# Patient Record
Sex: Female | Born: 1975
Health system: Southern US, Community
[De-identification: ages and names within clinical notes are randomized; demographics above are authoritative.]

## PROBLEM LIST (undated history)

## (undated) ENCOUNTER — Emergency Department (HOSPITAL_COMMUNITY): Discharge status: Absent without leave | Payer: No Typology Code available for payment source

## (undated) ENCOUNTER — Inpatient Hospital Stay (HOSPITAL_COMMUNITY): Payer: Self-pay

## (undated) DIAGNOSIS — F319 Bipolar disorder, unspecified: Secondary | ICD-10-CM

## (undated) DIAGNOSIS — Z8759 Personal history of other complications of pregnancy, childbirth and the puerperium: Secondary | ICD-10-CM

## (undated) DIAGNOSIS — I10 Essential (primary) hypertension: Secondary | ICD-10-CM

## (undated) HISTORY — PX: NO PAST SURGERIES: SHX2092

---

## 1998-02-27 ENCOUNTER — Ambulatory Visit (HOSPITAL_COMMUNITY): Admission: RE | Admit: 1998-02-27 | Discharge: 1998-02-27 | Payer: Self-pay | Admitting: Family Medicine

## 1998-02-27 ENCOUNTER — Encounter: Payer: Self-pay | Admitting: Family Medicine

## 1998-07-10 ENCOUNTER — Emergency Department (HOSPITAL_COMMUNITY): Admission: EM | Admit: 1998-07-10 | Discharge: 1998-07-10 | Payer: Self-pay | Admitting: Emergency Medicine

## 1998-09-09 ENCOUNTER — Emergency Department (HOSPITAL_COMMUNITY): Admission: EM | Admit: 1998-09-09 | Discharge: 1998-09-09 | Payer: Self-pay | Admitting: Emergency Medicine

## 1999-04-25 ENCOUNTER — Encounter: Payer: Self-pay | Admitting: Emergency Medicine

## 1999-04-25 ENCOUNTER — Emergency Department (HOSPITAL_COMMUNITY): Admission: EM | Admit: 1999-04-25 | Discharge: 1999-04-25 | Payer: Self-pay | Admitting: Emergency Medicine

## 2000-03-25 ENCOUNTER — Emergency Department (HOSPITAL_COMMUNITY): Admission: EM | Admit: 2000-03-25 | Discharge: 2000-03-25 | Payer: Self-pay | Admitting: Emergency Medicine

## 2001-02-04 ENCOUNTER — Other Ambulatory Visit: Admission: RE | Admit: 2001-02-04 | Discharge: 2001-02-04 | Payer: Self-pay | Admitting: *Deleted

## 2001-05-10 ENCOUNTER — Encounter (INDEPENDENT_AMBULATORY_CARE_PROVIDER_SITE_OTHER): Payer: Self-pay | Admitting: Specialist

## 2001-05-10 ENCOUNTER — Encounter: Payer: Self-pay | Admitting: *Deleted

## 2001-05-10 ENCOUNTER — Inpatient Hospital Stay (HOSPITAL_COMMUNITY): Admission: AD | Admit: 2001-05-10 | Discharge: 2001-05-13 | Payer: Self-pay | Admitting: *Deleted

## 2002-05-16 ENCOUNTER — Inpatient Hospital Stay (HOSPITAL_COMMUNITY): Admission: AD | Admit: 2002-05-16 | Discharge: 2002-05-16 | Payer: Self-pay | Admitting: Obstetrics and Gynecology

## 2002-05-18 ENCOUNTER — Inpatient Hospital Stay (HOSPITAL_COMMUNITY): Admission: AD | Admit: 2002-05-18 | Discharge: 2002-05-18 | Payer: Self-pay | Admitting: Obstetrics and Gynecology

## 2003-09-06 ENCOUNTER — Emergency Department (HOSPITAL_COMMUNITY): Admission: EM | Admit: 2003-09-06 | Discharge: 2003-09-06 | Payer: Self-pay | Admitting: Emergency Medicine

## 2003-10-06 ENCOUNTER — Emergency Department (HOSPITAL_COMMUNITY): Admission: EM | Admit: 2003-10-06 | Discharge: 2003-10-06 | Payer: Self-pay

## 2004-03-11 ENCOUNTER — Inpatient Hospital Stay (HOSPITAL_COMMUNITY): Admission: AD | Admit: 2004-03-11 | Discharge: 2004-03-11 | Payer: Self-pay | Admitting: *Deleted

## 2004-03-14 ENCOUNTER — Inpatient Hospital Stay (HOSPITAL_COMMUNITY): Admission: AD | Admit: 2004-03-14 | Discharge: 2004-03-14 | Payer: Self-pay | Admitting: Obstetrics & Gynecology

## 2004-03-16 ENCOUNTER — Inpatient Hospital Stay (HOSPITAL_COMMUNITY): Admission: AD | Admit: 2004-03-16 | Discharge: 2004-03-16 | Payer: Self-pay | Admitting: Obstetrics and Gynecology

## 2004-03-24 ENCOUNTER — Inpatient Hospital Stay (HOSPITAL_COMMUNITY): Admission: AD | Admit: 2004-03-24 | Discharge: 2004-03-24 | Payer: Self-pay | Admitting: Family Medicine

## 2004-03-27 ENCOUNTER — Inpatient Hospital Stay (HOSPITAL_COMMUNITY): Admission: AD | Admit: 2004-03-27 | Discharge: 2004-03-27 | Payer: Self-pay | Admitting: Obstetrics & Gynecology

## 2004-03-30 ENCOUNTER — Inpatient Hospital Stay (HOSPITAL_COMMUNITY): Admission: AD | Admit: 2004-03-30 | Discharge: 2004-03-30 | Payer: Self-pay | Admitting: *Deleted

## 2004-04-09 ENCOUNTER — Inpatient Hospital Stay (HOSPITAL_COMMUNITY): Admission: AD | Admit: 2004-04-09 | Discharge: 2004-04-09 | Payer: Self-pay | Admitting: Obstetrics and Gynecology

## 2004-09-17 ENCOUNTER — Inpatient Hospital Stay (HOSPITAL_COMMUNITY): Admission: AD | Admit: 2004-09-17 | Discharge: 2004-09-17 | Payer: Self-pay | Admitting: Family Medicine

## 2004-09-28 ENCOUNTER — Inpatient Hospital Stay (HOSPITAL_COMMUNITY): Admission: AD | Admit: 2004-09-28 | Discharge: 2004-09-28 | Payer: Self-pay | Admitting: *Deleted

## 2004-10-31 ENCOUNTER — Inpatient Hospital Stay (HOSPITAL_COMMUNITY): Admission: AD | Admit: 2004-10-31 | Discharge: 2004-10-31 | Payer: Self-pay | Admitting: Obstetrics & Gynecology

## 2005-04-07 ENCOUNTER — Inpatient Hospital Stay (HOSPITAL_COMMUNITY): Admission: RE | Admit: 2005-04-07 | Discharge: 2005-04-09 | Payer: Self-pay | Admitting: *Deleted

## 2005-04-07 ENCOUNTER — Ambulatory Visit: Payer: Self-pay | Admitting: *Deleted

## 2005-04-11 ENCOUNTER — Inpatient Hospital Stay (HOSPITAL_COMMUNITY): Admission: AD | Admit: 2005-04-11 | Discharge: 2005-04-11 | Payer: Self-pay | Admitting: Obstetrics

## 2005-04-15 ENCOUNTER — Ambulatory Visit (HOSPITAL_COMMUNITY): Admission: RE | Admit: 2005-04-15 | Discharge: 2005-04-15 | Payer: Self-pay | Admitting: Obstetrics & Gynecology

## 2005-04-15 ENCOUNTER — Ambulatory Visit: Payer: Self-pay | Admitting: Obstetrics & Gynecology

## 2005-04-17 ENCOUNTER — Ambulatory Visit: Payer: Self-pay | Admitting: *Deleted

## 2005-04-22 ENCOUNTER — Ambulatory Visit: Payer: Self-pay | Admitting: *Deleted

## 2005-04-24 ENCOUNTER — Ambulatory Visit: Payer: Self-pay | Admitting: *Deleted

## 2005-04-29 ENCOUNTER — Ambulatory Visit: Payer: Self-pay | Admitting: Obstetrics & Gynecology

## 2005-04-29 ENCOUNTER — Ambulatory Visit (HOSPITAL_COMMUNITY): Admission: RE | Admit: 2005-04-29 | Discharge: 2005-04-29 | Payer: Self-pay | Admitting: Obstetrics & Gynecology

## 2005-05-01 ENCOUNTER — Ambulatory Visit: Payer: Self-pay | Admitting: *Deleted

## 2005-05-01 ENCOUNTER — Inpatient Hospital Stay (HOSPITAL_COMMUNITY): Admission: AD | Admit: 2005-05-01 | Discharge: 2005-05-04 | Payer: Self-pay | Admitting: Family Medicine

## 2005-05-02 ENCOUNTER — Encounter (INDEPENDENT_AMBULATORY_CARE_PROVIDER_SITE_OTHER): Payer: Self-pay | Admitting: Specialist

## 2005-05-27 ENCOUNTER — Inpatient Hospital Stay (HOSPITAL_COMMUNITY): Admission: AD | Admit: 2005-05-27 | Discharge: 2005-05-27 | Payer: Self-pay | Admitting: Obstetrics and Gynecology

## 2005-06-08 ENCOUNTER — Emergency Department (HOSPITAL_COMMUNITY): Admission: EM | Admit: 2005-06-08 | Discharge: 2005-06-08 | Payer: Self-pay | Admitting: Emergency Medicine

## 2005-06-26 ENCOUNTER — Inpatient Hospital Stay (HOSPITAL_COMMUNITY): Admission: AD | Admit: 2005-06-26 | Discharge: 2005-06-26 | Payer: Self-pay | Admitting: Gynecology

## 2006-01-13 ENCOUNTER — Ambulatory Visit: Payer: Self-pay | Admitting: Family Medicine

## 2006-01-20 ENCOUNTER — Ambulatory Visit: Payer: Self-pay | Admitting: Family Medicine

## 2006-01-25 ENCOUNTER — Inpatient Hospital Stay (HOSPITAL_COMMUNITY): Admission: AD | Admit: 2006-01-25 | Discharge: 2006-01-25 | Payer: Self-pay | Admitting: Gynecology

## 2006-03-01 ENCOUNTER — Ambulatory Visit (HOSPITAL_COMMUNITY): Admission: RE | Admit: 2006-03-01 | Discharge: 2006-03-01 | Payer: Self-pay | Admitting: Gynecology

## 2006-03-03 ENCOUNTER — Ambulatory Visit: Payer: Self-pay | Admitting: Obstetrics & Gynecology

## 2006-07-02 IMAGING — US US OB TRANSVAGINAL
1 series · 14 of 28 positions shown · non-contrast
Comparison: none

CLINICAL DATA: 6 week 6 day gestational age by LMP.  Inappropriately rising beta HCG level currently at 673.
TRANSVAGINAL OBSTETRICAL ULTRASOUND:
Transvaginal sonography was performed, and comparison made to the prior study on 03/11/04.
Maximum double-layer endometrial thickness measures 8 mm.  There is no evidence of an intrauterine gestational sac or other endometrial fluid collections.  No uterine masses are seen. 
A simple cyst is seen in the right ovary measuring 2 x 2.1 x 2.5 cm, and a simple cyst is also seen in the left ovary measuring 3.6 x 2.4 x 2.8 cm.  These are both new since the previous study.  No ovarian or adnexal masses are seen and there is no evidence of free fluid.

[Series 1: us ob transvaginal · 0.15mm/px · 14 of 52 slices shown]
[im 2/52]
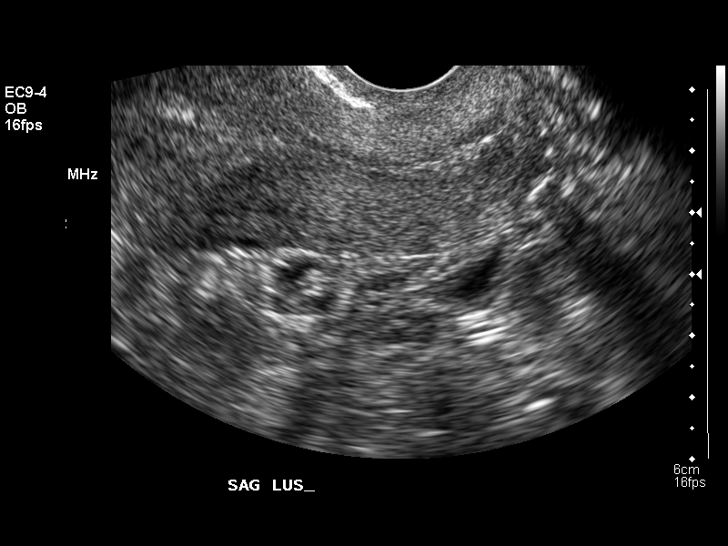
[im 6/52]
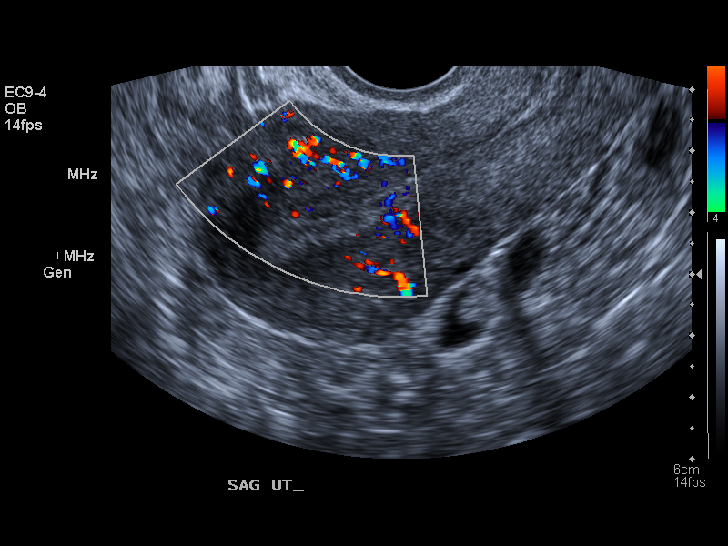
[im 10/52]
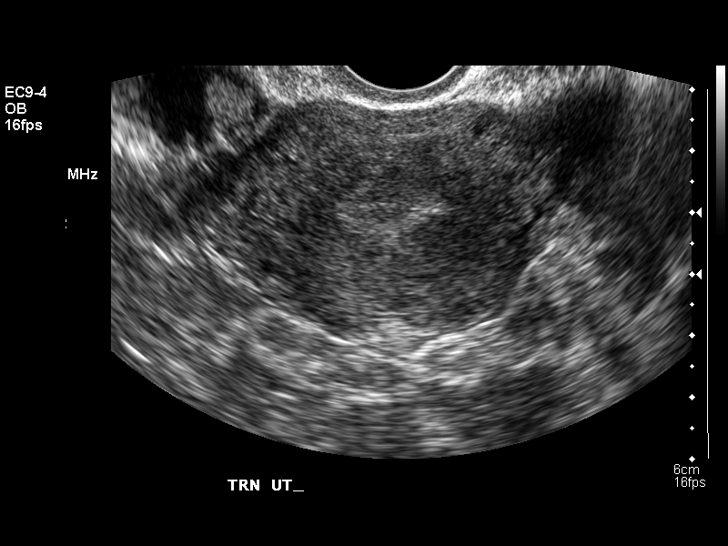
[im 14/52]
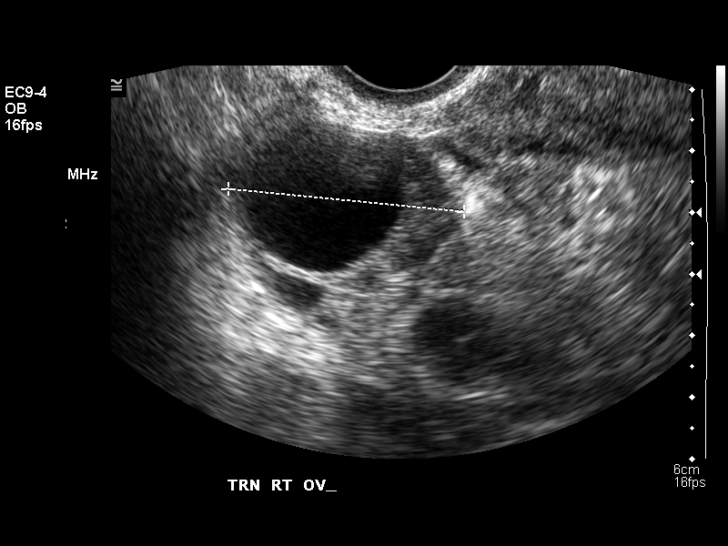
[im 18/52]
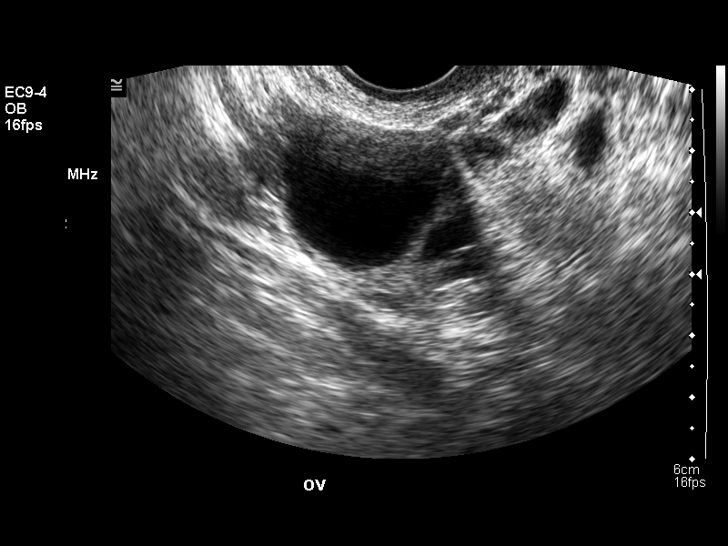
[im 21/52]
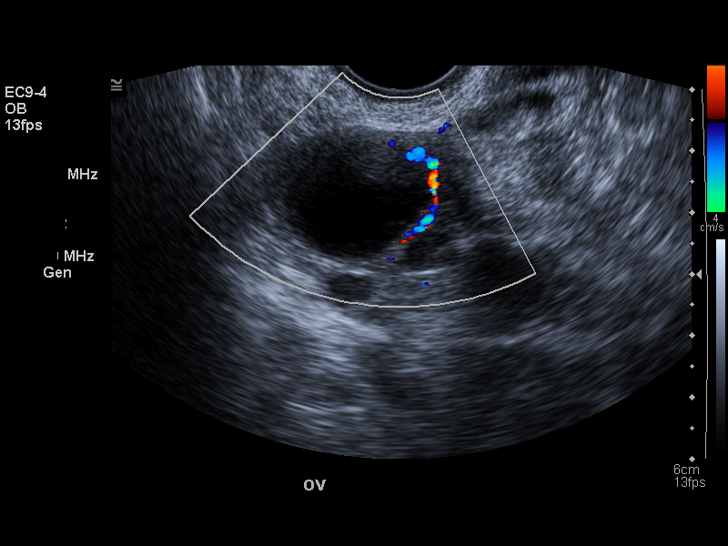
[im 25/52]
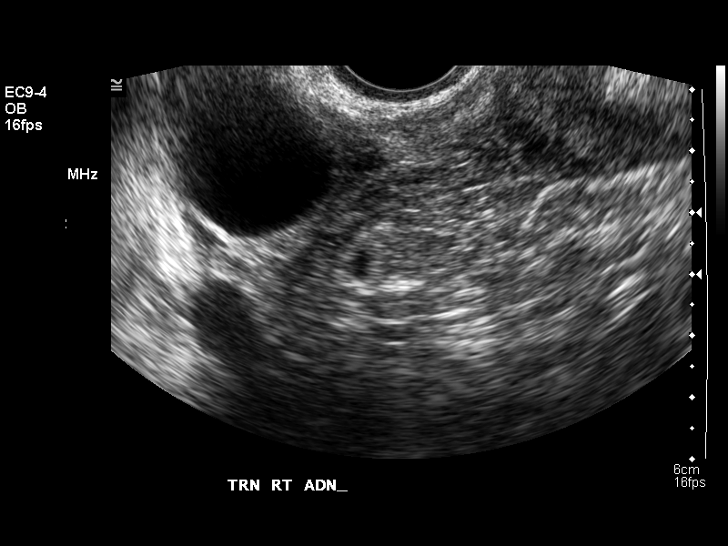
[im 29/52]
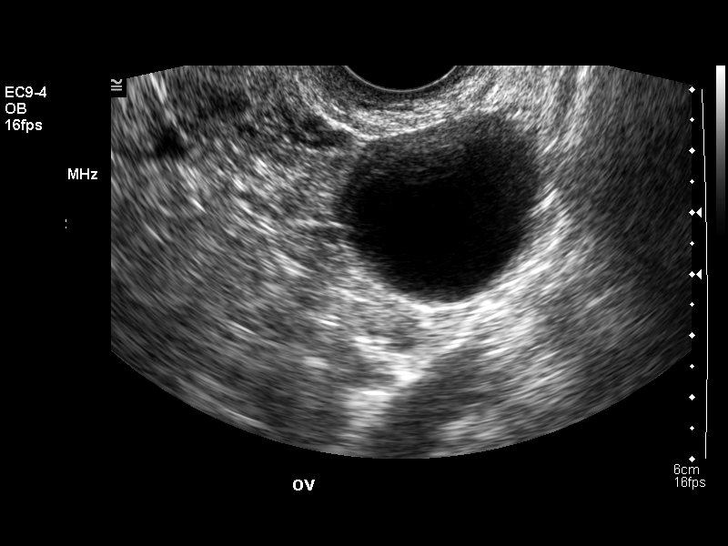
[im 33/52]
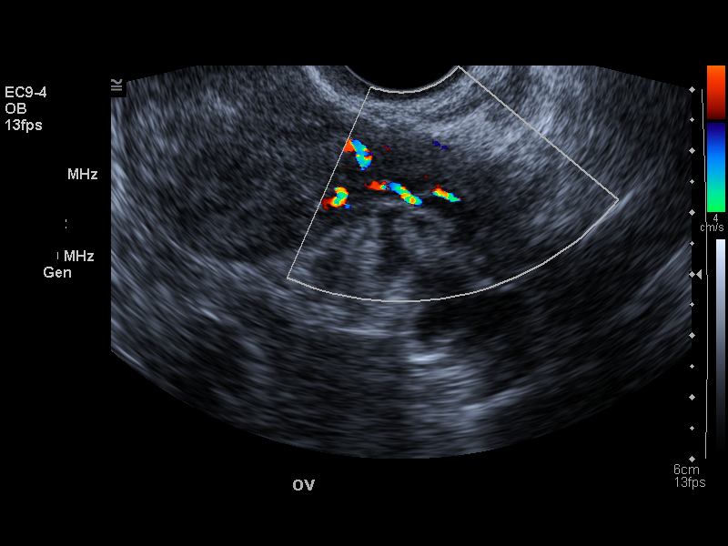
[im 36/52]
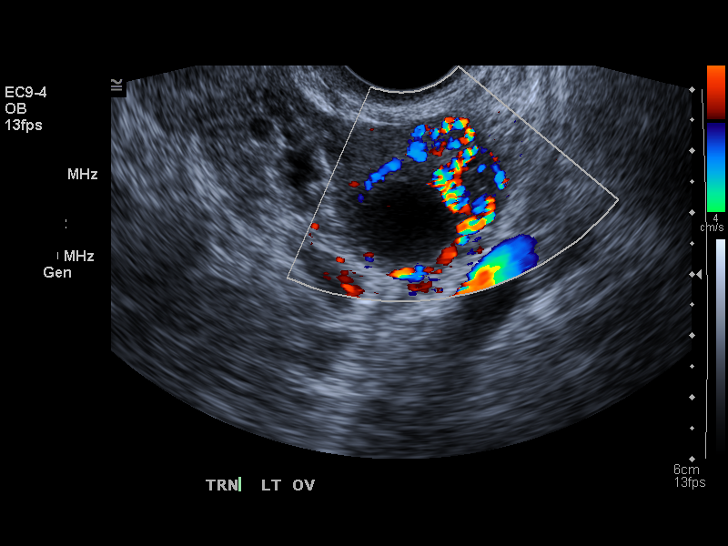
[im 40/52]
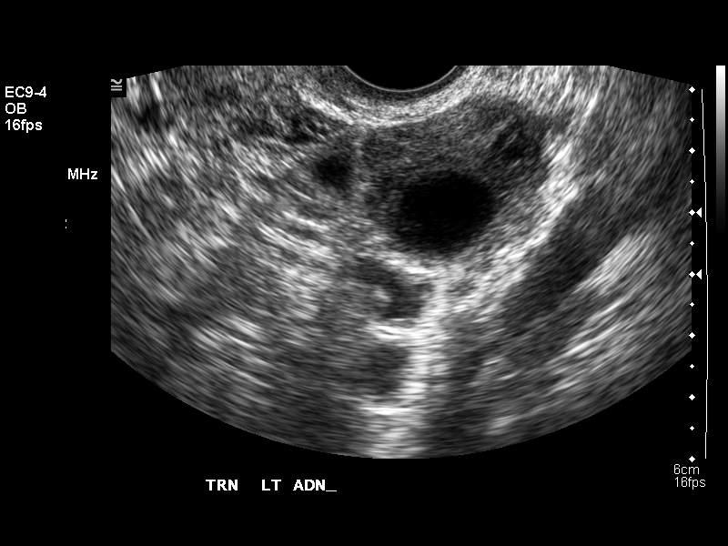
[im 44/52]
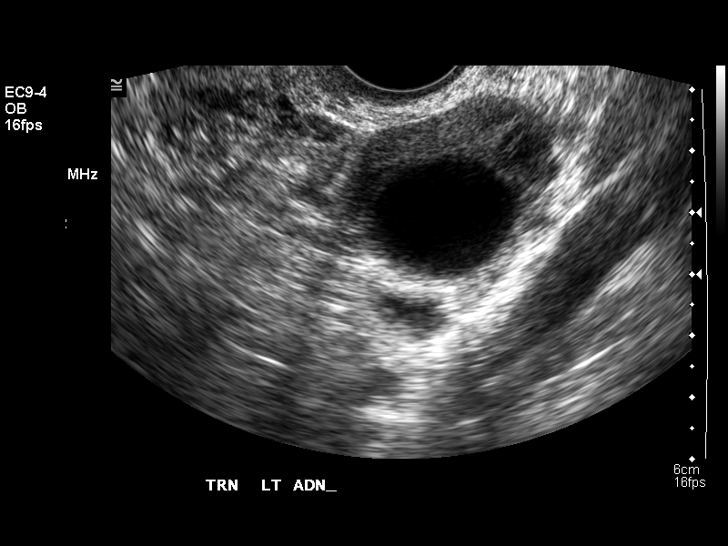
[im 48/52]
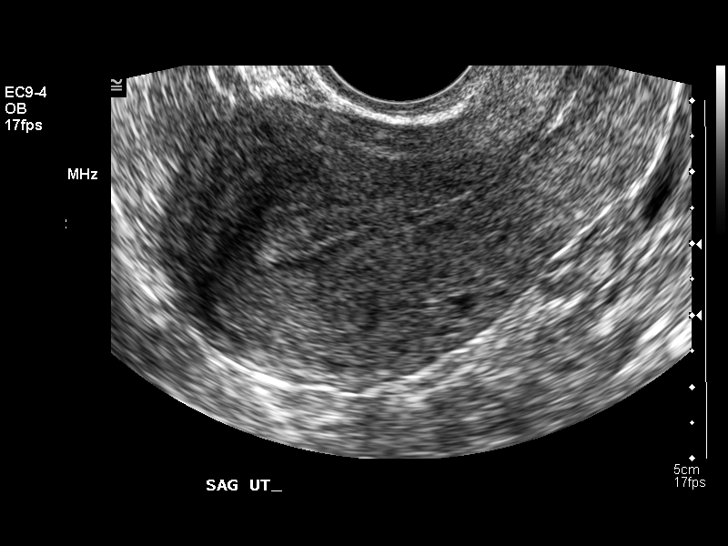
[im 52/52]
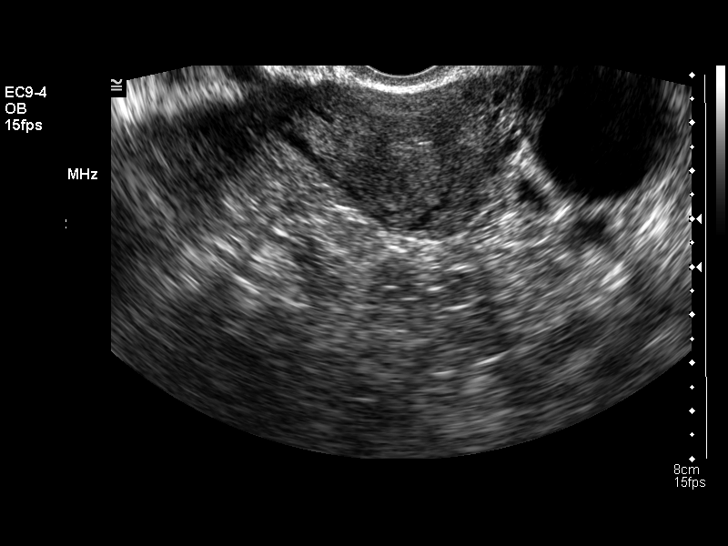

[14 of 28 positions shown; findings below may reference images not displayed]

IMPRESSION: 1.  No sonographic evidence of an intrauterine gestational sac or adnexal mass.  These findings are suspicious for an abnormal IUP or recent spontaneous abortion, although an ectopic pregnancy cannot definitely be excluded.  Continued follow-up of quantitative beta HCG levels is recommended.
2.  Simple bilateral ovarian cysts measuring 2.8 cm in the right ovary and 3.6 cm in the left ovary.  No evidence of free fluid.

## 2007-03-10 ENCOUNTER — Emergency Department (HOSPITAL_COMMUNITY): Admission: EM | Admit: 2007-03-10 | Discharge: 2007-03-10 | Payer: Self-pay | Admitting: Emergency Medicine

## 2007-03-21 ENCOUNTER — Ambulatory Visit: Payer: Self-pay | Admitting: Internal Medicine

## 2007-07-11 ENCOUNTER — Encounter: Payer: Self-pay | Admitting: Family Medicine

## 2007-07-11 ENCOUNTER — Ambulatory Visit: Payer: Self-pay | Admitting: Family Medicine

## 2007-07-11 LAB — CONVERTED CEMR LAB: GC Probe Amp, Genital: NEGATIVE

## 2007-07-16 IMAGING — US US FETAL BPP W/O NONSTRESS
1 series · 17 of 28 positions shown · non-contrast
Comparison: none

CLINICAL DATA: Size less than dates; decels; smoker.  Assigned gestational age is 35 weeks 1 day.

[Series 1: us ob comp +14 wk · 17 of 42 slices shown]
[im 1/42]
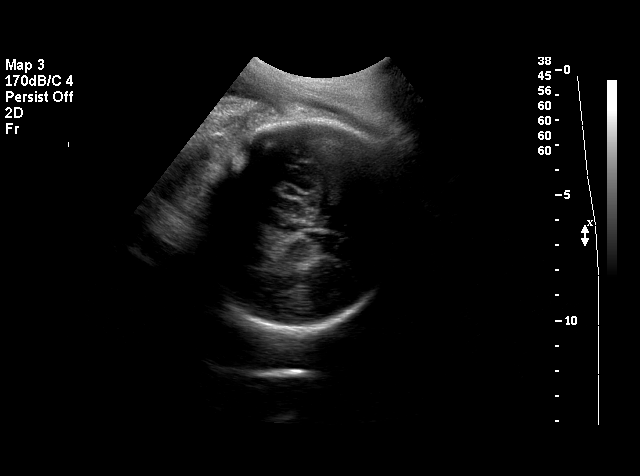
[im 4/42]
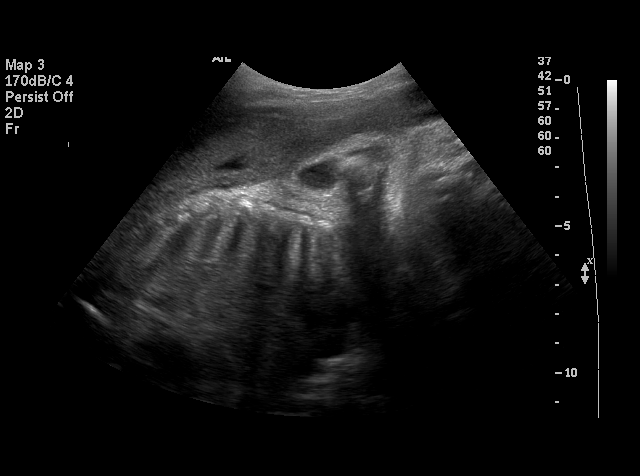
[im 7/42]
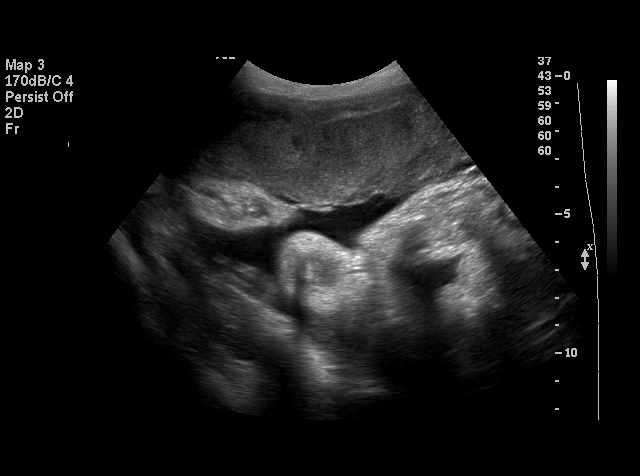
[im 8/42]
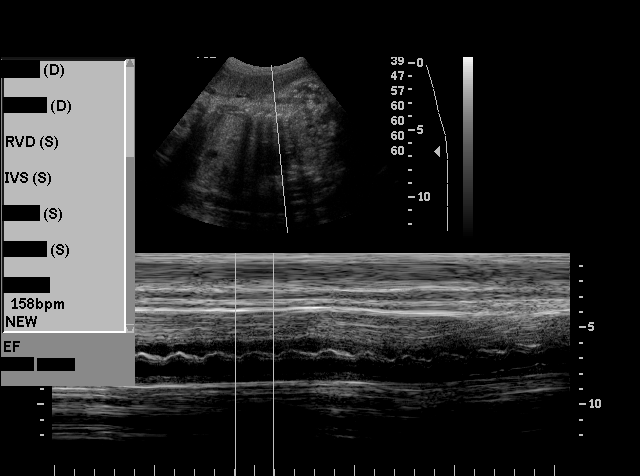
[im 11/42]
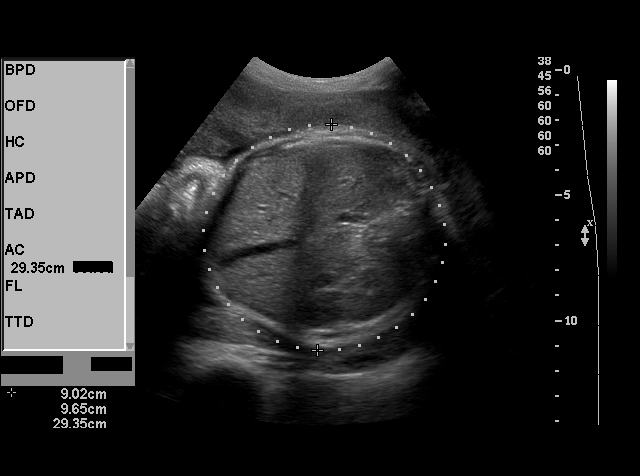
[im 14/42]
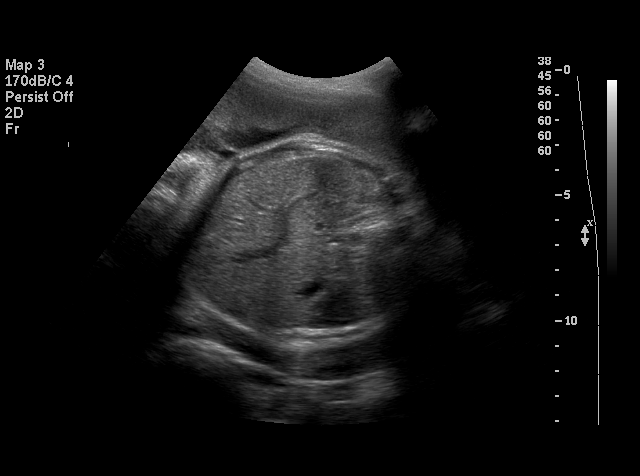
[im 16/42]
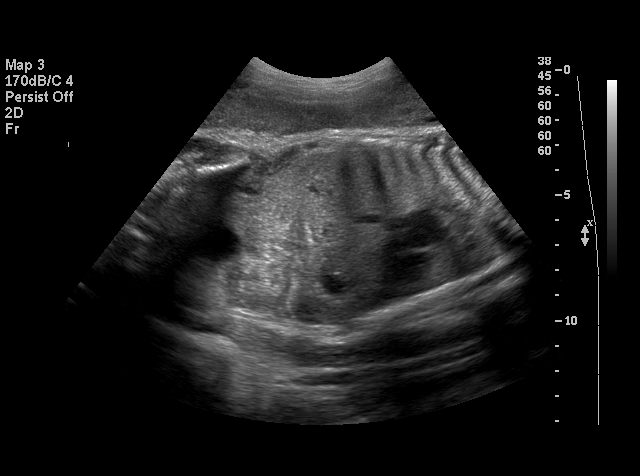
[im 19/42]
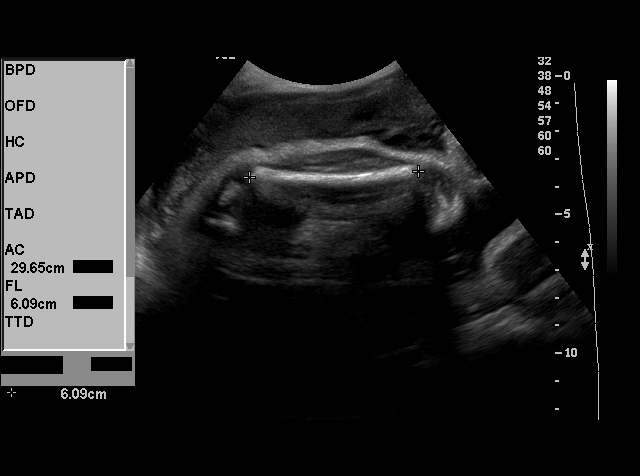
[im 22/42]
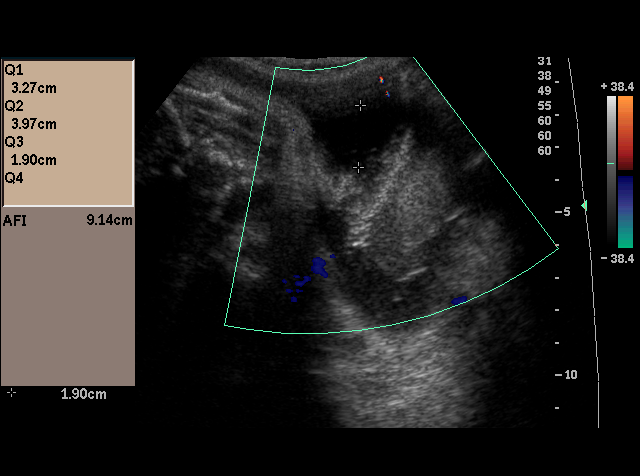
[im 23/42]
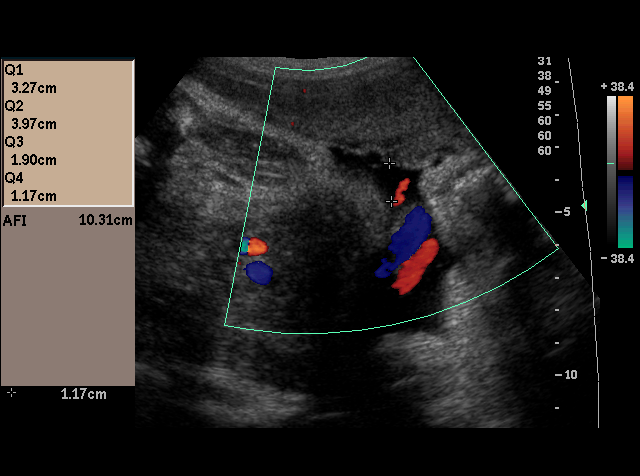
[im 26/42]
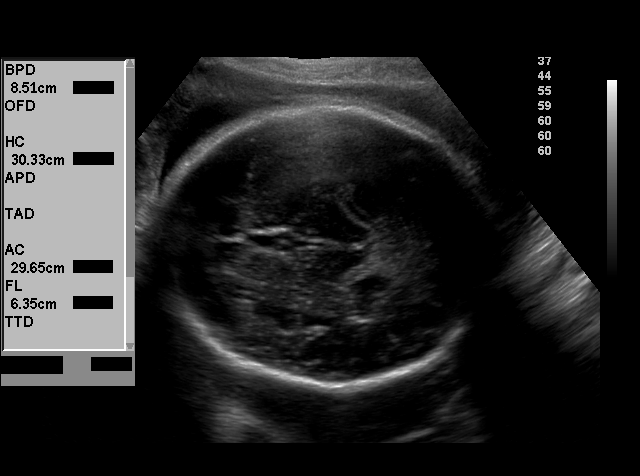
[im 28/42]
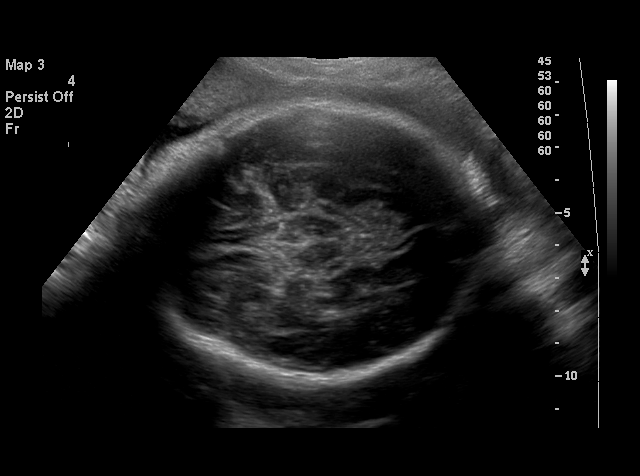
[im 31/42]
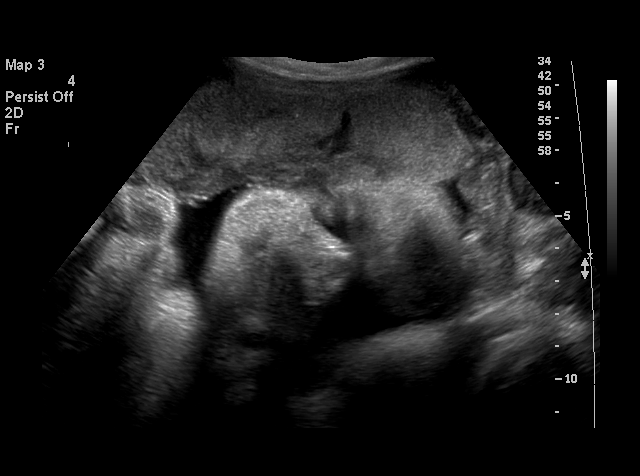
[im 34/42]
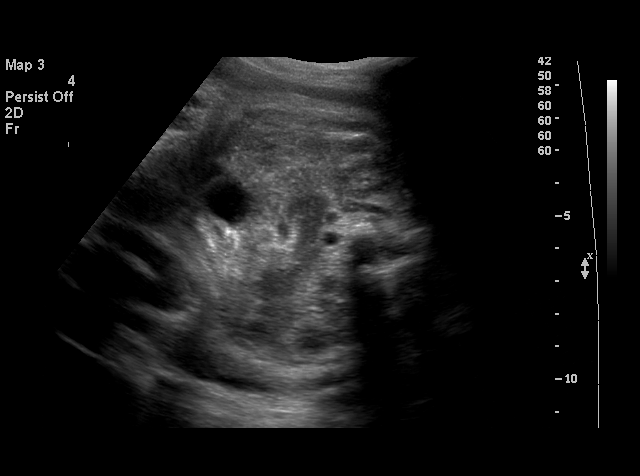
[im 35/42]
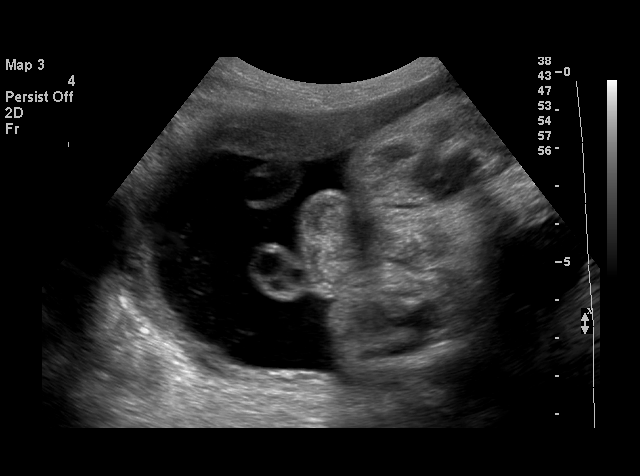
[im 38/42]
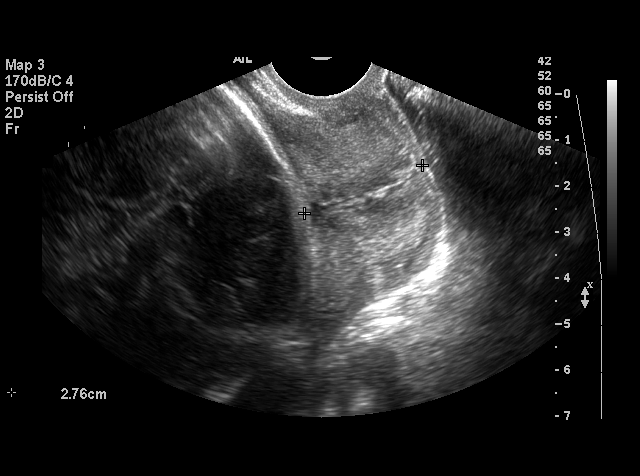
[im 42/42]
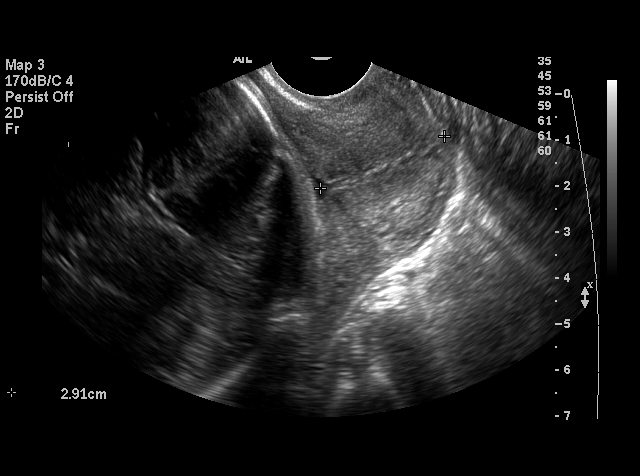

[17 of 28 positions shown; findings below may reference images not displayed]

OBSTETRICAL ULTRASOUND AND TRANSVAGINAL OB US:
 Number of Fetuses:  1
 Heart Rate:  158
 Movement:  Yes
 Breathing:  Yes  
 Presentation:  Cephalic
 Placental Location:  Anterior
 Grade:  II
 Previa:  No
 Amniotic Fluid (Subjective):  Normal
 Amniotic Fluid (Objective):   10.3 cm AFI (5th -95th%ile = 7.9 ? 24.9 cm for 35 wks)

 FETAL BIOMETRY
 BPD:   8.4 cm  34 w 0 d
 HC:   30.6 cm  34 w 1 d
 AC:   29.6 cm   33 w 5 d
 FL:    6.2 cm  32 w 3 d

 MEAN GA:  33 w 4 d  US EDC:  05/22/05
 Assigned GA:  35 w 1 d  Assigned EDC:  05/11/05    
 EFW:  6936 g (H) 25th ? 50th%ile (2128 ? 4797 g) For 35 wks

 FETAL ANATOMY
 Lateral Ventricles:    Visualized 
 Thalami/CSP:      Visualized 
 Posterior Fossa:  Not visualized 
 Nuchal Region:    N/A
 Spine:      Not visualized 
 4 Chamber Heart on Left:      Visualized 
 Stomach on Left:      Visualized 
 3 Vessel Cord:    Visualized 
 Cord Insertion site:    Not visualized 
 Kidneys:  Visualized 
 Bladder:  Visualized 
 Extremities:      Not visualized 

 ADDITIONAL ANATOMY VISUALIZED:  Upper lip, diaphragm, and male genitalia.

 Evaluation limited by:  Advanced gestational age.

 MATERNAL UTERINE AND ADNEXAL FINDINGS
 Cervix: 2.9 cm Transvaginally

 BIOPHYSICAL PROFILE

 Movement:  2    Time:  30 minutes
 Breathing:  2
 Tone:  2
 Amniotic Fluid:  2

 Total Score:  8
IMPRESSION: 1.  There is a single living intrauterine gestation in cephalic presentation.  The mean gestational age by today's ultrasound is 33 weeks 4 days which is 1 ? weeks younger than estimated by LMP; however, the fetal indices remain concordant.  The estimated fetal weight is at the 25th ? 50th percentile for a 35 week gestation.  
 2.  The biophysical score is [DATE] over a 30 minute period.  
 3.  The cervix is slightly shortened measuring 2.9 cm, transvaginally.

## 2007-08-03 ENCOUNTER — Ambulatory Visit (HOSPITAL_COMMUNITY): Admission: RE | Admit: 2007-08-03 | Discharge: 2007-08-03 | Payer: Medicaid Other | Admitting: Family Medicine

## 2007-09-02 ENCOUNTER — Other Ambulatory Visit: Admission: RE | Admit: 2007-09-02 | Discharge: 2007-09-02 | Payer: Self-pay | Admitting: Obstetrics and Gynecology

## 2007-09-02 ENCOUNTER — Ambulatory Visit: Payer: Self-pay | Admitting: Gynecology

## 2007-09-21 ENCOUNTER — Ambulatory Visit: Payer: Self-pay | Admitting: Obstetrics and Gynecology

## 2008-01-09 ENCOUNTER — Ambulatory Visit: Payer: Self-pay | Admitting: Family Medicine

## 2008-01-25 ENCOUNTER — Encounter: Payer: Self-pay | Admitting: Family Medicine

## 2008-01-25 ENCOUNTER — Ambulatory Visit: Payer: Self-pay | Admitting: Internal Medicine

## 2008-01-25 LAB — CONVERTED CEMR LAB
Alkaline Phosphatase: 100 units/L (ref 39–117)
Basophils Absolute: 0 10*3/uL (ref 0.0–0.1)
CO2: 24 meq/L (ref 19–32)
Cholesterol: 226 mg/dL — ABNORMAL HIGH (ref 0–200)
Creatinine, Ser: 0.85 mg/dL (ref 0.40–1.20)
Eosinophils Absolute: 0 10*3/uL (ref 0.0–0.7)
Eosinophils Relative: 1 % (ref 0–5)
Glucose, Bld: 113 mg/dL — ABNORMAL HIGH (ref 70–99)
HCT: 42.2 % (ref 36.0–46.0)
Hemoglobin: 13.8 g/dL (ref 12.0–15.0)
Lymphocytes Relative: 34 % (ref 12–46)
Lymphs Abs: 1.9 10*3/uL (ref 0.7–4.0)
MCV: 91.7 fL (ref 78.0–100.0)
Monocytes Absolute: 0.3 10*3/uL (ref 0.1–1.0)
RDW: 13.1 % (ref 11.5–15.5)
Total Bilirubin: 0.4 mg/dL (ref 0.3–1.2)
Total CHOL/HDL Ratio: 3.6
Triglycerides: 152 mg/dL — ABNORMAL HIGH (ref <150)
VLDL: 30 mg/dL (ref 0–40)

## 2008-08-22 ENCOUNTER — Ambulatory Visit (HOSPITAL_COMMUNITY): Admission: RE | Admit: 2008-08-22 | Discharge: 2008-08-22 | Payer: Self-pay | Admitting: Family Medicine

## 2008-08-22 ENCOUNTER — Encounter (INDEPENDENT_AMBULATORY_CARE_PROVIDER_SITE_OTHER): Payer: Self-pay | Admitting: Internal Medicine

## 2008-08-22 ENCOUNTER — Ambulatory Visit: Payer: Self-pay | Admitting: Family Medicine

## 2008-08-22 LAB — CONVERTED CEMR LAB
AST: 27 units/L (ref 0–37)
Alkaline Phosphatase: 108 units/L (ref 39–117)
BUN: 10 mg/dL (ref 6–23)
Basophils Absolute: 0 10*3/uL (ref 0.0–0.1)
Basophils Relative: 0 % (ref 0–1)
Calcium: 9.7 mg/dL (ref 8.4–10.5)
Chloride: 102 meq/L (ref 96–112)
Creatinine, Ser: 0.92 mg/dL (ref 0.40–1.20)
Eosinophils Absolute: 0 10*3/uL (ref 0.0–0.7)
Eosinophils Relative: 1 % (ref 0–5)
HCT: 39.2 % (ref 36.0–46.0)
MCHC: 33.2 g/dL (ref 30.0–36.0)
MCV: 91.4 fL (ref 78.0–100.0)
Platelets: 384 10*3/uL (ref 150–400)
RDW: 14 % (ref 11.5–15.5)
Total Bilirubin: 0.3 mg/dL (ref 0.3–1.2)

## 2008-12-03 ENCOUNTER — Emergency Department (HOSPITAL_COMMUNITY): Admission: EM | Admit: 2008-12-03 | Discharge: 2008-12-04 | Payer: Self-pay | Admitting: Emergency Medicine

## 2009-02-07 ENCOUNTER — Emergency Department (HOSPITAL_COMMUNITY): Admission: EM | Admit: 2009-02-07 | Discharge: 2009-02-07 | Payer: Self-pay | Admitting: Emergency Medicine

## 2009-05-14 ENCOUNTER — Emergency Department (HOSPITAL_COMMUNITY): Admission: EM | Admit: 2009-05-14 | Discharge: 2009-05-14 | Payer: Self-pay | Admitting: Emergency Medicine

## 2009-12-16 ENCOUNTER — Emergency Department (HOSPITAL_COMMUNITY): Admission: EM | Admit: 2009-12-16 | Discharge: 2009-12-16 | Payer: Self-pay | Admitting: Emergency Medicine

## 2010-04-20 ENCOUNTER — Encounter: Payer: Self-pay | Admitting: *Deleted

## 2010-05-20 ENCOUNTER — Emergency Department (HOSPITAL_BASED_OUTPATIENT_CLINIC_OR_DEPARTMENT_OTHER)
Admission: EM | Admit: 2010-05-20 | Discharge: 2010-05-20 | Disposition: A | Discharge status: Home / Self Care | Payer: Self-pay | Attending: Emergency Medicine | Admitting: Emergency Medicine

## 2010-05-20 DIAGNOSIS — K219 Gastro-esophageal reflux disease without esophagitis: Secondary | ICD-10-CM | POA: Insufficient documentation

## 2010-05-20 DIAGNOSIS — I10 Essential (primary) hypertension: Secondary | ICD-10-CM | POA: Insufficient documentation

## 2010-05-20 DIAGNOSIS — F319 Bipolar disorder, unspecified: Secondary | ICD-10-CM | POA: Insufficient documentation

## 2010-05-20 LAB — DIFFERENTIAL
Basophils Absolute: 0 10*3/uL (ref 0.0–0.1)
Lymphocytes Relative: 40 % (ref 12–46)
Monocytes Absolute: 0.7 10*3/uL (ref 0.1–1.0)
Neutro Abs: 3.3 10*3/uL (ref 1.7–7.7)

## 2010-05-20 LAB — POCT CARDIAC MARKERS
CKMB, poc: <1 ng/mL — ABNORMAL LOW (ref 1.0–8.0)
Troponin i, poc: <0.05 ng/mL (ref 0.00–0.09)

## 2010-05-20 LAB — CBC
Hemoglobin: 11.2 g/dL — ABNORMAL LOW (ref 12.0–15.0)
MCH: 25.9 pg — ABNORMAL LOW (ref 26.0–34.0)
Platelets: 317 10*3/uL (ref 150–400)
RBC: 4.33 MIL/uL (ref 3.87–5.11)
WBC: 6.8 10*3/uL (ref 4.0–10.5)

## 2010-05-20 LAB — URINALYSIS, ROUTINE W REFLEX MICROSCOPIC
Bilirubin Urine: NEGATIVE
Nitrite: NEGATIVE
Specific Gravity, Urine: 1.011 (ref 1.005–1.030)
pH: 6.5 (ref 5.0–8.0)

## 2010-05-20 LAB — BASIC METABOLIC PANEL
BUN: 9 mg/dL (ref 6–23)
Calcium: 9.1 mg/dL (ref 8.4–10.5)
Creatinine, Ser: 0.9 mg/dL (ref 0.4–1.2)
GFR calc Af Amer: >60 mL/min (ref >60)
GFR calc non Af Amer: >60 mL/min (ref >60)

## 2010-06-12 LAB — DIFFERENTIAL
Basophils Absolute: 0 10*3/uL (ref 0.0–0.1)
Eosinophils Relative: 0 % (ref 0–5)
Lymphocytes Relative: 11 % — ABNORMAL LOW (ref 12–46)
Lymphs Abs: 1.2 10*3/uL (ref 0.7–4.0)
Neutro Abs: 9.1 10*3/uL — ABNORMAL HIGH (ref 1.7–7.7)
Neutrophils Relative %: 79 % — ABNORMAL HIGH (ref 43–77)

## 2010-06-12 LAB — POCT I-STAT, CHEM 8
Creatinine, Ser: 0.9 mg/dL (ref 0.4–1.2)
HCT: 38 % (ref 36.0–46.0)
Hemoglobin: 12.9 g/dL (ref 12.0–15.0)
Sodium: 137 mEq/L (ref 135–145)
TCO2: 23 mmol/L (ref 0–100)

## 2010-06-12 LAB — CBC
Platelets: 285 10*3/uL (ref 150–400)
RBC: 4.61 MIL/uL (ref 3.87–5.11)
RDW: 13.9 % (ref 11.5–15.5)
WBC: 11.5 10*3/uL — ABNORMAL HIGH (ref 4.0–10.5)

## 2010-06-12 LAB — PROTIME-INR
INR: 0.93 (ref 0.00–1.49)
Prothrombin Time: 12.7 seconds (ref 11.6–15.2)

## 2010-07-02 LAB — POCT I-STAT, CHEM 8
Creatinine, Ser: 0.9 mg/dL (ref 0.4–1.2)
Glucose, Bld: 93 mg/dL (ref 70–99)
Hemoglobin: 17 g/dL — ABNORMAL HIGH (ref 12.0–15.0)
Sodium: 135 mEq/L (ref 135–145)
TCO2: 26 mmol/L (ref 0–100)

## 2010-07-02 LAB — BASIC METABOLIC PANEL
CO2: 26 mEq/L (ref 19–32)
Calcium: 9.8 mg/dL (ref 8.4–10.5)
Creatinine, Ser: 0.9 mg/dL (ref 0.4–1.2)
GFR calc Af Amer: >60 mL/min (ref >60)
GFR calc non Af Amer: >60 mL/min (ref >60)
Glucose, Bld: 97 mg/dL (ref 70–99)

## 2010-07-04 LAB — COMPREHENSIVE METABOLIC PANEL
ALT: 13 U/L (ref 0–35)
AST: 15 U/L (ref 0–37)
CO2: 25 mEq/L (ref 19–32)
Calcium: 8.3 mg/dL — ABNORMAL LOW (ref 8.4–10.5)
Chloride: 108 mEq/L (ref 96–112)
GFR calc Af Amer: >60 mL/min (ref >60)
GFR calc non Af Amer: 53 mL/min — ABNORMAL LOW (ref >60)
Sodium: 136 mEq/L (ref 135–145)

## 2010-07-04 LAB — URINALYSIS, ROUTINE W REFLEX MICROSCOPIC
Glucose, UA: NEGATIVE mg/dL
pH: 5.5 (ref 5.0–8.0)

## 2010-07-04 LAB — URINE CULTURE: Colony Count: >=100000

## 2010-07-04 LAB — TROPONIN I: Troponin I: 0.02 ng/mL (ref 0.00–0.06)

## 2010-07-04 LAB — DIFFERENTIAL
Basophils Absolute: 0 10*3/uL (ref 0.0–0.1)
Basophils Relative: 0 % (ref 0–1)
Lymphocytes Relative: 38 % (ref 12–46)
Neutro Abs: 3.6 10*3/uL (ref 1.7–7.7)

## 2010-07-04 LAB — CBC
MCHC: 33.3 g/dL (ref 30.0–36.0)
RBC: 3.85 MIL/uL — ABNORMAL LOW (ref 3.87–5.11)
WBC: 6.7 10*3/uL (ref 4.0–10.5)

## 2010-07-04 LAB — RAPID URINE DRUG SCREEN, HOSP PERFORMED: Tetrahydrocannabinol: NOT DETECTED

## 2010-07-04 LAB — CK TOTAL AND CKMB (NOT AT ARMC): CK, MB: 0.9 ng/mL (ref 0.3–4.0)

## 2010-07-04 LAB — URINE MICROSCOPIC-ADD ON

## 2010-07-04 LAB — ACETAMINOPHEN LEVEL: Acetaminophen (Tylenol), Serum: <10 ug/mL — ABNORMAL LOW (ref 10–30)

## 2010-08-15 NOTE — Group Therapy Note (Signed)
NAME:  [name], [name] NO.:  [id number]   MEDICAL RECORD NO.:  [id number]          PATIENT TYPE:  WOC   LOCATION:  [facility]                   FACILITY:  [facility]   PHYSICIAN:  Dorthula Perfect, MD     DATE OF BIRTH:  July 09, 1975   DATE OF SERVICE:                                  CLINIC NOTE   HISTORY OF PRESENT ILLNESS:  A 35 year old white female, gravida 2, para  2, presents today having been seen in the MAU with an ovarian cyst.  She  delivered back in February 2007.  She received Depo-Provera at one time.  During the last spring, early summer, she began cycling on a normal  basis, but then started having continuous vaginal bleeding.  For that,  she was seen in the MAU at the end of October, and an ultrasound at that  time revealed a complex ovarian cyst.  The left ovary measured  4.2x1.8x2.7 cm and the cyst within it measured 2.1x1.0x1.6.  Right ovary  was normal.  Follow up ultrasound on December 3 revealed both ovaries to  be completely normal.  She had a normal period last week.   PHYSICAL EXAMINATION:  ABDOMEN:  Soft and nontender.  PELVIC:  External genitalia/BUS glands are normal.  Vaginal vault is  epithelialized as was the cervix.  Uterus is in midline and is normal  size and shape.  Both ovaries are normal.   IMPRESSION:  1. Left ovarian cyst, resolved.  2. Contraception, oral.   DISPOSITION:  The patient is desirous of being on birth control pills.  She is given a prescription for Yaz with refills for a year.  In  addition, she is given three samples boxes.  She is instructed to start  the first pill pack on the first Sunday after she starts her next  menstrual period.  Her next Pap smear is due in March, having had a  normal one a year ago.           ______________________________  Dorthula Perfect, MD     ER/MEDQ  D:  03/03/2006  T:  03/04/2006  Job:  [id number]
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
## 2010-08-15 NOTE — Discharge Summary (Signed)
[name], [name]                ACCOUNT NO.:  [id number]   MEDICAL RECORD NO.:  [id number]          PATIENT TYPE:  INP   LOCATION:  9107                          FACILITY:  [facility]   PHYSICIAN:  Conni Elliot, M.D.DATE OF BIRTH:  Apr 17, 1975   DATE OF ADMISSION:  04/07/2005  DATE OF DISCHARGE:  04/09/2005                                 DISCHARGE SUMMARY   DISCHARGE DIAGNOSES:  1.  Pregnancy at 35 and 5/7.  2.  ? Intrahepatic cholestasis pregnancy.  3.  Smoking.  4.  History of marijuana use.  5.  Gastroesophageal reflux disease.  6.  Candida vaginitis.   MEDICATIONS:  1.  Ursodiol 300 mg twice daily.  2.  Zantac 75 twice daily.  3.  Nicotine patch 21 for 24 hours daily.  4.  Tylenol 650 mg every six hours as needed.  5.  Atarax 50 mg every six hours as needed.   PROCEDURES:  None.   HOSPITAL COURSE:  The patient is a 35 year old G6, P1-0-4-1 with a history  of ectopic and three spontaneous abortions that presented for her first  clinic office visit on April 07, 2005 with elevated blood pressures  140s/100s, and elevated LFTs with AST and ALT of 59 and 98.  The patient has  moved around and had several different sources for her prenatal care.  She  also had a main complaint of itching.  Her bilirubin, uric acid, and LDH  were all normal.  At admit, her creatinine was slightly elevated at 0.7.  Urine protein and then subsequently 24-hour protein levels were essentially  zero.  Creatinine clearance was 105 and her creatinine level returned to  normal.  At admission, she also had some evidence of mild hemoconcentration  with hemoglobin of 14.2.  This also resolved during her hospital stay.  Acute hepatitis panel was negative.  Her AST and ALT improved when repeated  on April 08, 2005.  Her strep was reassuring throughout her stay.  Cervix  check showed a 1 cm thick and -2.  She had no consistent contractions noted.  We ordered bile salts to confirm diagnosis of ICP, and  those are pending at  the time of discharge.  Urine drug screen was negative.   LABORATORY:  AST and ALT were 35 and 78 at discharge.  Hemoglobin was 12.9  down from 14.3.  Uric acid was 4 at discharge.  LDH was 114 at discharge.  Total protein over 24 hours was less than 6.  Creatinine clearance was 105  with a creatinine level of 0.6 at discharge.  Acute hepatitis panel was  negative.  Urine drug screen was negative.  Bile acid salts were pending at  discharge.   FOLLOW UP:  The patient will follow-up at the [facility] at  9:00 o'clock on February 13, 2006.  She will also follow-up at the MAU on  February 09, 2006 for labs including PIH panel, and non-stress test.  She  was discharged home with typical antenatal preterm labor precautions and  instructions.      Angeline Slim, M.D.  ______________________________  Conni Elliot, M.D.    AL/MEDQ  D:  04/09/2005  T:  04/09/2005  Job:  [id number]

## 2010-12-25 LAB — POCT PREGNANCY, URINE
Operator id: 297281
Preg Test, Ur: NEGATIVE

## 2011-05-17 ENCOUNTER — Encounter (HOSPITAL_COMMUNITY): Payer: Self-pay

## 2011-05-17 ENCOUNTER — Emergency Department (HOSPITAL_COMMUNITY)
Admission: EM | Admit: 2011-05-17 | Discharge: 2011-05-17 | Disposition: A | Discharge status: Home / Self Care | Payer: Self-pay | Attending: Emergency Medicine | Admitting: Emergency Medicine

## 2011-05-17 DIAGNOSIS — K047 Periapical abscess without sinus: Secondary | ICD-10-CM | POA: Insufficient documentation

## 2011-05-17 DIAGNOSIS — K029 Dental caries, unspecified: Secondary | ICD-10-CM | POA: Insufficient documentation

## 2011-05-17 DIAGNOSIS — R112 Nausea with vomiting, unspecified: Secondary | ICD-10-CM | POA: Insufficient documentation

## 2011-05-17 DIAGNOSIS — K089 Disorder of teeth and supporting structures, unspecified: Secondary | ICD-10-CM | POA: Insufficient documentation

## 2011-05-17 DIAGNOSIS — I10 Essential (primary) hypertension: Secondary | ICD-10-CM | POA: Insufficient documentation

## 2011-05-17 DIAGNOSIS — Z79899 Other long term (current) drug therapy: Secondary | ICD-10-CM | POA: Insufficient documentation

## 2011-05-17 DIAGNOSIS — K1370 Unspecified lesions of oral mucosa: Secondary | ICD-10-CM | POA: Insufficient documentation

## 2011-05-17 HISTORY — DX: Essential (primary) hypertension: I10

## 2011-05-17 MED ORDER — CLINDAMYCIN HCL 150 MG PO CAPS: 300.0000 mg | ORAL_CAPSULE | Freq: Three times a day (TID) | ORAL | Status: AC

## 2011-05-17 MED ORDER — OXYCODONE-ACETAMINOPHEN 5-325 MG PO TABS
1.0000 | ORAL_TABLET | Freq: Once | ORAL | Status: AC
  Administered 2011-05-17: 1 via ORAL
  Filled 2011-05-17: qty 1

## 2011-05-17 MED ORDER — HYDROCODONE-ACETAMINOPHEN 5-500 MG PO TABS: 1.0000 | ORAL_TABLET | Freq: Four times a day (QID) | ORAL | Status: AC | PRN

## 2011-05-17 MED ORDER — IBUPROFEN 600 MG PO TABS: 600.0000 mg | ORAL_TABLET | Freq: Four times a day (QID) | ORAL | Status: AC | PRN

## 2011-05-17 NOTE — ED Provider Notes (Signed)
History     CSN: [id number]  Arrival date & time 05/17/11  1323   First MD Initiated Contact with Patient 05/17/11 1338      Chief Complaint  Patient presents with  . Dental Pain    (Consider location/radiation/quality/duration/timing/severity/associated sxs/prior treatment) Patient is a 36 y.o. female presenting with tooth pain. The history is provided by the patient.  Dental PainThe primary symptoms include mouth pain. Primary symptoms do not include fever. The symptoms began less than 1 hour ago.  Pt states she has had problems with right lower molar tooth for last 8 months. States that yesterday pain worsened. Has swelling around that tooth, pain not relieved by ibuprofen. No fever, no facial swelling. Admits to nausea and vomiting today. No recent dental injury.  Past Medical History  Diagnosis Date  . Hypertension     History reviewed. No pertinent past surgical history.  No family history on file.  History  Substance Use Topics  . Smoking status: Former Games developer  . Smokeless tobacco: Not on file  . Alcohol Use: No    OB History    Grav Para Term Preterm Abortions TAB SAB Ect Mult Living                  Review of Systems  Constitutional: Negative for fever and chills.  HENT: Positive for dental problem. Negative for neck pain.   Eyes: Negative.   Respiratory: Negative.   Cardiovascular: Negative.   Gastrointestinal: Negative.   Genitourinary: Negative.   Musculoskeletal: Negative.   Skin: Negative.   Neurological: Negative.   Psychiatric/Behavioral: Negative.     Allergies  Latex and Penicillins  Home Medications   Current Outpatient Rx  Name Route Sig Dispense Refill  . GABAPENTIN 400 MG PO CAPS Oral Take 400 mg by mouth 2 (two) times daily.    Marland Kitchen HYDROCHLOROTHIAZIDE 12.5 MG PO CAPS Oral Take 12.5 mg by mouth daily.    . IBUPROFEN 200 MG PO TABS Oral Take 800-1,000 mg by mouth every 4 (four) hours as needed. For pain.    Marland Kitchen LAMOTRIGINE 200 MG PO  TABS Oral Take 400 mg by mouth daily.    Marland Kitchen LISINOPRIL 20 MG PO TABS Oral Take 20 mg by mouth daily.    . QUETIAPINE FUMARATE 50 MG PO TABS Oral Take 50 mg by mouth at bedtime.      BP 151/91  Pulse 96  Temp 98.8 F (37.1 C)  Resp 18  Wt 121 lb (54.885 kg)  SpO2 100%  Physical Exam  Nursing note and vitals reviewed. Constitutional: She is oriented to person, place, and time. She appears well-developed and well-nourished.       Uncomfortable appearing  HENT:  Head: Normocephalic and atraumatic.       Decay of right lower 3rd molar with surrounding inflammation, erythema, swelling. Tender to palpation. No facial swelilng  Eyes: Conjunctivae are normal.  Neck: Neck supple.  Cardiovascular: Normal rate and regular rhythm.   Pulmonary/Chest: Effort normal and breath sounds normal. No respiratory distress.  Musculoskeletal: Normal range of motion.  Lymphadenopathy:    She has no cervical adenopathy.  Neurological: She is alert and oriented to person, place, and time.  Skin: Skin is warm and dry. No erythema.  Psychiatric: She has a normal mood and affect.    ED Course  Procedures (including critical care time)  Pt with dental pain, possible early abscess. Will start on antibiotics, pain medications. Oral surgery follow up.    MDM  Lottie Mussel, PA 05/17/11 1421

## 2011-05-17 NOTE — ED Notes (Signed)
Patient reports that she thinks she has abcess where wisdom tooth is located-lower right gum. Patient reports vomiting and pain related to same, taking otc meds w/o relief

## 2011-05-17 NOTE — Discharge Instructions (Signed)
Your tooth appears like it may be getting infected. Start clindamycin as prescribed, take until all gone. Salt water mouth raises several times a day. Take ibuprofen for pain. Take vicodin as prescribed as needed for severe pain. Follow up with an oral surgeon as referred.   Abscessed Tooth A tooth abscess is a collection of infected fluid (pus) from a bacterial infection in the inner part of the tooth (pulp). It usually occurs at the end of the tooth's root.  CAUSES   A very bad cavity (extensive tooth decay).   Trauma to the tooth, such as a broken or chipped tooth, that allows bacteria to enter into the pulp.  SYMPTOMS  Severe pain in and around the infected tooth.   Swelling and redness around the abscessed tooth or in the mouth or face.   Tenderness.   Pus drainage.   Bad breath.   Bitter taste in the mouth.   Difficulty swallowing.   Difficulty opening the mouth.   Feeling sick to your stomach (nauseous).   Vomiting.   Chills.   Swollen neck glands.  DIAGNOSIS  A medical and dental history will be taken.   An examination will be performed by tapping on the abscessed tooth.   X-rays may be taken of the tooth to identify the abscess.  TREATMENT The goal of treatment is to eliminate the infection.   You may be prescribed antibiotic medicine to stop the infection from spreading.   A root canal may be performed to save the tooth. If the tooth cannot be saved, it may be pulled (extracted) and the abscess may be drained.  HOME CARE INSTRUCTIONS  Only take over-the-counter or prescription medicines for pain, fever, or discomfort as directed by your caregiver.   Do not drive after taking pain medicine (narcotics).   Rinse your mouth (gargle) often with salt water ( tsp salt in 8 oz of warm water) to relieve pain or swelling.   Do not apply heat to the outside of your face.   Return to your dentist for further treatment as directed.  SEEK IMMEDIATE DENTAL CARE  IF:  You have a temperature by mouth above 102 F (38.9 C), not controlled by medicine.   You have chills or a very bad headache.   You have problems breathing or swallowing.   Your have trouble opening your mouth.   You develop swelling in the neck or around the eye.   Your pain is not helped by medicine.   Your pain is getting worse instead of better.  Document Released: 03/16/2005 Document Revised: 11/26/2010 Document Reviewed: 06/24/2010 [facility] Patient Information 2012 [place], [place].

## 2011-05-18 NOTE — ED Provider Notes (Signed)
Medical screening examination/treatment/procedure(s) were performed by non-physician practitioner and as supervising physician I was immediately available for consultation/collaboration.  Hurman Horn, MD 05/18/11 2154

## 2011-12-30 ENCOUNTER — Emergency Department (HOSPITAL_BASED_OUTPATIENT_CLINIC_OR_DEPARTMENT_OTHER)
Admission: EM | Admit: 2011-12-30 | Discharge: 2011-12-30 | Disposition: A | Discharge status: Home / Self Care | Payer: Self-pay | Attending: Emergency Medicine | Admitting: Emergency Medicine

## 2011-12-30 ENCOUNTER — Encounter (HOSPITAL_BASED_OUTPATIENT_CLINIC_OR_DEPARTMENT_OTHER): Payer: Self-pay | Admitting: Family Medicine

## 2011-12-30 DIAGNOSIS — H538 Other visual disturbances: Secondary | ICD-10-CM | POA: Insufficient documentation

## 2011-12-30 HISTORY — DX: Bipolar disorder, unspecified: F31.9

## 2011-12-30 NOTE — ED Notes (Addendum)
Pt sts she thinks she might have taken an extra dose of lamictal today accidentally, pt took med at 0945 and began symptoms began about 30 mins ago. Pt c/o feeling "shaky", vision problems and feels like "my heart is racing".

## 2011-12-30 NOTE — ED Provider Notes (Signed)
History     CSN: [id number]  Arrival date & time 12/30/11  1201   First MD Initiated Contact with Patient 12/30/11 1227      Chief Complaint  Patient presents with  . Eye Problem    (Consider location/radiation/quality/duration/timing/severity/associated sxs/prior treatment) HPI Comments: Patient presents with intermittent blurred vision that began today around 12:00 while at work. Patient states that her heart began to race, she felt diaphoretic and she felt as if her "eyes were crossed". She attributes this to possibly taking double her daily dose of Lamictal. She states that she has never taken too much medication in the past. Denies fever or chills. Denies headache, eye pain, diplopia, or floaters.       The history is provided by the patient.    Past Medical History  Diagnosis Date  . Hypertension   . Bipolar 1 disorder     History reviewed. No pertinent past surgical history.  No family history on file.  History  Substance Use Topics  . Smoking status: Current Every Day Smoker  . Smokeless tobacco: Not on file  . Alcohol Use: No    OB History    Grav Para Term Preterm Abortions TAB SAB Ect Mult Living                  Review of Systems  Constitutional: Positive for diaphoresis. Negative for fever and chills.  Eyes: Positive for visual disturbance. Negative for photophobia and pain.  Cardiovascular: Positive for palpitations.  Neurological: Negative for headaches.    Allergies  Latex and Penicillins  Home Medications   Current Outpatient Rx  Name Route Sig Dispense Refill  . GABAPENTIN 400 MG PO CAPS Oral Take 400 mg by mouth 2 (two) times daily.    Marland Kitchen HYDROCHLOROTHIAZIDE 12.5 MG PO CAPS Oral Take 12.5 mg by mouth daily.    . IBUPROFEN 200 MG PO TABS Oral Take 800-1,000 mg by mouth every 4 (four) hours as needed. For pain.    Marland Kitchen LAMOTRIGINE 200 MG PO TABS Oral Take 400 mg by mouth daily.    Marland Kitchen LISINOPRIL 20 MG PO TABS Oral Take 20 mg by mouth daily.     . QUETIAPINE FUMARATE 50 MG PO TABS Oral Take 50 mg by mouth at bedtime.      BP 153/98  Pulse 92  Temp 98.5 F (36.9 C) (Oral)  Resp 16  Ht 5\' 6"  (1.676 m)  Wt 117 lb (53.071 kg)  BMI 18.88 kg/m2  SpO2 100%  LMP 12/19/2011  Physical Exam  Nursing note and vitals reviewed. Constitutional: She appears well-developed and well-nourished. No distress.  HENT:  Head: Normocephalic and atraumatic.  Mouth/Throat: Oropharynx is clear and moist.  Eyes: Conjunctivae normal and EOM are normal. Pupils are equal, round, and reactive to light. No scleral icterus.       No deficits of peripheral fields by confrontation. Sclera clear without injection or discharge.  Neck: Normal range of motion. Neck supple.  Cardiovascular: Normal rate, regular rhythm and normal heart sounds.   Pulmonary/Chest: Effort normal and breath sounds normal.  Abdominal: Soft. Bowel sounds are normal. There is no tenderness.  Neurological: She is alert.  Skin: Skin is warm and dry.    ED Course  Procedures (including critical care time)  Labs Reviewed - No data to display No results found.   1. Blurred vision, bilateral       MDM  Patient presented with blurry vision after possibly taking too much Lamictal. Visual  acuity was assessed uncorrected as patient left her glasses at work. No gross vision or peripheral field deficits noted. Patient reassessed and feeling better. Patient discharges with return precautions. No red flags for retinal detachment.         Pixie Casino, PA-C 12/30/11 1427

## 2011-12-30 NOTE — ED Provider Notes (Signed)
History/physical exam/procedure(s) were performed by non-physician practitioner and as supervising physician I was immediately available for consultation/collaboration. I have reviewed all notes and am in agreement with care and plan.   Hilario Quarry, MD 12/30/11 [phone]

## 2012-01-26 ENCOUNTER — Emergency Department (HOSPITAL_BASED_OUTPATIENT_CLINIC_OR_DEPARTMENT_OTHER)
Admission: EM | Admit: 2012-01-26 | Discharge: 2012-01-26 | Disposition: A | Discharge status: Home / Self Care | Payer: Self-pay | Attending: Emergency Medicine | Admitting: Emergency Medicine

## 2012-01-26 ENCOUNTER — Encounter (HOSPITAL_BASED_OUTPATIENT_CLINIC_OR_DEPARTMENT_OTHER): Payer: Self-pay | Admitting: Emergency Medicine

## 2012-01-26 DIAGNOSIS — F17200 Nicotine dependence, unspecified, uncomplicated: Secondary | ICD-10-CM | POA: Insufficient documentation

## 2012-01-26 DIAGNOSIS — N898 Other specified noninflammatory disorders of vagina: Secondary | ICD-10-CM | POA: Insufficient documentation

## 2012-01-26 DIAGNOSIS — Z8742 Personal history of other diseases of the female genital tract: Secondary | ICD-10-CM | POA: Insufficient documentation

## 2012-01-26 DIAGNOSIS — N939 Abnormal uterine and vaginal bleeding, unspecified: Secondary | ICD-10-CM

## 2012-01-26 DIAGNOSIS — F319 Bipolar disorder, unspecified: Secondary | ICD-10-CM | POA: Insufficient documentation

## 2012-01-26 DIAGNOSIS — I10 Essential (primary) hypertension: Secondary | ICD-10-CM | POA: Insufficient documentation

## 2012-01-26 DIAGNOSIS — Z79899 Other long term (current) drug therapy: Secondary | ICD-10-CM | POA: Insufficient documentation

## 2012-01-26 HISTORY — DX: Personal history of other complications of pregnancy, childbirth and the puerperium: Z87.59

## 2012-01-26 LAB — WET PREP, GENITAL
Trich, Wet Prep: NONE SEEN
Yeast Wet Prep HPF POC: NONE SEEN

## 2012-01-26 LAB — URINALYSIS, ROUTINE W REFLEX MICROSCOPIC
Leukocytes, UA: NEGATIVE
Nitrite: NEGATIVE
Protein, ur: NEGATIVE mg/dL
Specific Gravity, Urine: 1.028 (ref 1.005–1.030)
Urobilinogen, UA: 0.2 mg/dL (ref 0.0–1.0)

## 2012-01-26 LAB — URINE MICROSCOPIC-ADD ON

## 2012-01-26 LAB — PREGNANCY, URINE: Preg Test, Ur: NEGATIVE

## 2012-01-26 NOTE — ED Notes (Signed)
Pt having vaginal bleeding x 3 weeks.  No tissue noted in bleeding.  Some cramping at home.  No known fever.  Took pregnancy test last Wednesday which was negative.

## 2012-01-26 NOTE — ED Provider Notes (Signed)
History     CSN: [id number]  Arrival date & time 01/26/12  1559   None     Chief Complaint  Patient presents with  . Vaginal Bleeding    (Consider location/radiation/quality/duration/timing/severity/associated sxs/prior treatment) Patient is a 36 y.o. female presenting with vaginal bleeding. The history is provided by the patient. No language interpreter was used.  Vaginal Bleeding This is a new problem. The current episode started 1 to 4 weeks ago. The problem occurs constantly. The problem has been unchanged. Nothing aggravates the symptoms. She has tried nothing for the symptoms.   Pt worried that she could be pregnant.  Pt reports she has been spotting for 3 weeks.   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 Hypertension   . Bipolar 1 disorder   . History of miscarriage     No past surgical history on file.  No family history on file.  History  Substance Use Topics  . Smoking status: Current Every Day Smoker  . Smokeless tobacco: Not on file  . Alcohol Use: No    OB History    Grav Para Term Preterm Abortions TAB SAB Ect Mult Living                  Review of Systems  Genitourinary: Positive for vaginal bleeding.  All other systems reviewed and are negative.    Allergies  Latex and Penicillins  Home Medications   Current Outpatient Rx  Name Route Sig Dispense Refill  . GABAPENTIN 400 MG PO CAPS Oral Take 400 mg by mouth 2 (two) times daily.    Marland Kitchen HYDROCHLOROTHIAZIDE 12.5 MG PO CAPS Oral Take 12.5 mg by mouth daily.    . IBUPROFEN 200 MG PO TABS Oral Take 800-1,000 mg by mouth every 4 (four) hours as needed. For pain.    Marland Kitchen LAMOTRIGINE 200 MG PO TABS Oral Take 400 mg by mouth daily.    . QUETIAPINE FUMARATE 50 MG PO TABS Oral Take 50 mg by mouth at bedtime.      BP 139/94  Pulse 73  Temp 99 F (37.2 C) (Oral)  Resp 16  SpO2 100%  LMP 01/05/2012  Physical Exam  Nursing note and vitals  reviewed. Constitutional: She appears well-developed and well-nourished.  HENT:  Head: Normocephalic and atraumatic.  Eyes: Conjunctivae normal are normal. Pupils are equal, round, and reactive to light.  Neck: Normal range of motion.  Cardiovascular: Normal rate and normal heart sounds.   Pulmonary/Chest: Effort normal.  Abdominal: Soft. There is tenderness.  Genitourinary: No vaginal discharge found.       No bleeding,  Adnexa nontender,  No mass, cervix nontender  Musculoskeletal: Normal range of motion.  Neurological: She is alert.  Skin: Skin is warm.    ED Course  Procedures (including critical care time)  Labs Reviewed  URINALYSIS, ROUTINE W REFLEX MICROSCOPIC - Abnormal; Notable for the following:    Hgb urine dipstick TRACE (*)     Bilirubin Urine SMALL (*)     All other components within normal limits  PREGNANCY, URINE  URINE MICROSCOPIC-ADD ON   No results found.   1. Vaginal bleeding       MDM          Elson Areas, [place] 01/26/12 1751

## 2012-01-27 LAB — GC/CHLAMYDIA PROBE AMP, GENITAL
Chlamydia, DNA Probe: NEGATIVE
GC Probe Amp, Genital: NEGATIVE

## 2012-01-29 NOTE — ED Provider Notes (Signed)
History/physical exam/procedure(s) were performed by non-physician practitioner and as supervising physician I was immediately available for consultation/collaboration. I have reviewed all notes and am in agreement with care and plan.   Hilario Quarry, MD 01/29/12 Marlyne Beards

## 2012-05-30 ENCOUNTER — Encounter (HOSPITAL_COMMUNITY): Payer: Self-pay | Admitting: Emergency Medicine

## 2012-05-30 ENCOUNTER — Emergency Department (HOSPITAL_COMMUNITY)
Admission: EM | Admit: 2012-05-30 | Discharge: 2012-05-30 | Disposition: A | Discharge status: Home / Self Care | Payer: No Typology Code available for payment source | Attending: Emergency Medicine | Admitting: Emergency Medicine

## 2012-05-30 ENCOUNTER — Emergency Department (HOSPITAL_COMMUNITY): Payer: No Typology Code available for payment source

## 2012-05-30 DIAGNOSIS — I10 Essential (primary) hypertension: Secondary | ICD-10-CM | POA: Insufficient documentation

## 2012-05-30 DIAGNOSIS — F17200 Nicotine dependence, unspecified, uncomplicated: Secondary | ICD-10-CM | POA: Insufficient documentation

## 2012-05-30 DIAGNOSIS — S298XXA Other specified injuries of thorax, initial encounter: Secondary | ICD-10-CM | POA: Insufficient documentation

## 2012-05-30 DIAGNOSIS — R079 Chest pain, unspecified: Secondary | ICD-10-CM

## 2012-05-30 DIAGNOSIS — Y92410 Unspecified street and highway as the place of occurrence of the external cause: Secondary | ICD-10-CM | POA: Insufficient documentation

## 2012-05-30 DIAGNOSIS — Z8659 Personal history of other mental and behavioral disorders: Secondary | ICD-10-CM | POA: Insufficient documentation

## 2012-05-30 DIAGNOSIS — Y9389 Activity, other specified: Secondary | ICD-10-CM | POA: Insufficient documentation

## 2012-05-30 MED ORDER — OXYCODONE-ACETAMINOPHEN 5-325 MG PO TABS
1.0000 | ORAL_TABLET | Freq: Once | ORAL | Status: AC
  Administered 2012-05-30: 1 via ORAL
  Filled 2012-05-30: qty 1

## 2012-05-30 MED ORDER — HYDROCODONE-ACETAMINOPHEN 5-325 MG PO TABS: 1.0000 | ORAL_TABLET | ORAL | Status: DC | PRN
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
## 2013-10-02 ENCOUNTER — Inpatient Hospital Stay (HOSPITAL_COMMUNITY): Payer: Medicaid Other

## 2013-10-02 ENCOUNTER — Encounter (HOSPITAL_COMMUNITY): Payer: Self-pay

## 2013-10-02 ENCOUNTER — Inpatient Hospital Stay (HOSPITAL_COMMUNITY)
Admission: AD | Admit: 2013-10-02 | Discharge: 2013-10-02 | Disposition: A | Discharge status: Home / Self Care | Payer: Medicaid Other | Source: Ambulatory Visit | Attending: Family Medicine | Admitting: Family Medicine

## 2013-10-02 DIAGNOSIS — F319 Bipolar disorder, unspecified: Secondary | ICD-10-CM | POA: Diagnosis not present

## 2013-10-02 DIAGNOSIS — O2390 Unspecified genitourinary tract infection in pregnancy, unspecified trimester: Secondary | ICD-10-CM | POA: Diagnosis present

## 2013-10-02 DIAGNOSIS — I10 Essential (primary) hypertension: Secondary | ICD-10-CM | POA: Diagnosis present

## 2013-10-02 DIAGNOSIS — N390 Urinary tract infection, site not specified: Secondary | ICD-10-CM

## 2013-10-02 DIAGNOSIS — F17200 Nicotine dependence, unspecified, uncomplicated: Secondary | ICD-10-CM | POA: Diagnosis present

## 2013-10-02 DIAGNOSIS — O26899 Other specified pregnancy related conditions, unspecified trimester: Secondary | ICD-10-CM

## 2013-10-02 DIAGNOSIS — R109 Unspecified abdominal pain: Secondary | ICD-10-CM | POA: Diagnosis not present

## 2013-10-02 DIAGNOSIS — O10019 Pre-existing essential hypertension complicating pregnancy, unspecified trimester: Secondary | ICD-10-CM | POA: Insufficient documentation

## 2013-10-02 DIAGNOSIS — O99330 Smoking (tobacco) complicating pregnancy, unspecified trimester: Secondary | ICD-10-CM | POA: Diagnosis not present

## 2013-10-02 LAB — URINALYSIS, ROUTINE W REFLEX MICROSCOPIC
BILIRUBIN URINE: NEGATIVE
Glucose, UA: NEGATIVE mg/dL
HGB URINE DIPSTICK: NEGATIVE
Ketones, ur: 15 mg/dL — AB
Leukocytes, UA: NEGATIVE
Nitrite: POSITIVE — AB
PH: 5.5 (ref 5.0–8.0)
Protein, ur: NEGATIVE mg/dL
SPECIFIC GRAVITY, URINE: 1.025 (ref 1.005–1.030)
Urobilinogen, UA: 0.2 mg/dL (ref 0.0–1.0)

## 2013-10-02 LAB — CBC WITH DIFFERENTIAL/PLATELET
BASOS ABS: 0 10*3/uL (ref 0.0–0.1)
BASOS PCT: 0 % (ref 0–1)
Eosinophils Absolute: 0 10*3/uL (ref 0.0–0.7)
Eosinophils Relative: 0 % (ref 0–5)
HEMATOCRIT: 40.5 % (ref 36.0–46.0)
Hemoglobin: 13.8 g/dL (ref 12.0–15.0)
Lymphocytes Relative: 20 % (ref 12–46)
Lymphs Abs: 2.1 10*3/uL (ref 0.7–4.0)
MCH: 31.6 pg (ref 26.0–34.0)
MCHC: 34.1 g/dL (ref 30.0–36.0)
MCV: 92.7 fL (ref 78.0–100.0)
MONO ABS: 0.7 10*3/uL (ref 0.1–1.0)
Monocytes Relative: 7 % (ref 3–12)
NEUTROS ABS: 7.9 10*3/uL — AB (ref 1.7–7.7)
Neutrophils Relative %: 73 % (ref 43–77)
Platelets: 293 10*3/uL (ref 150–400)
RBC: 4.37 MIL/uL (ref 3.87–5.11)
RDW: 12.9 % (ref 11.5–15.5)
WBC: 10.7 10*3/uL — ABNORMAL HIGH (ref 4.0–10.5)

## 2013-10-02 LAB — POCT PREGNANCY, URINE: PREG TEST UR: POSITIVE — AB

## 2013-10-02 LAB — OB RESULTS CONSOLE RUBELLA ANTIBODY, IGM: Rubella: IMMUNE

## 2013-10-02 LAB — WET PREP, GENITAL
TRICH WET PREP: NONE SEEN
WBC, Wet Prep HPF POC: NONE SEEN
Yeast Wet Prep HPF POC: NONE SEEN

## 2013-10-02 LAB — URINE MICROSCOPIC-ADD ON

## 2013-10-02 LAB — OB RESULTS CONSOLE HEPATITIS B SURFACE ANTIGEN: Hepatitis B Surface Ag: NEGATIVE

## 2013-10-02 LAB — OB RESULTS CONSOLE ANTIBODY SCREEN: Antibody Screen: NEGATIVE

## 2013-10-02 LAB — ABO/RH: ABO/RH(D): O POS

## 2013-10-02 LAB — OB RESULTS CONSOLE RPR: RPR: NONREACTIVE

## 2013-10-02 LAB — HCG, QUANTITATIVE, PREGNANCY: hCG, Beta Chain, Quant, S: 116860 m[IU]/mL — ABNORMAL HIGH (ref <5)

## 2013-10-02 MED ORDER — ACETAMINOPHEN 325 MG PO TABS
650.0000 mg | ORAL_TABLET | Freq: Once | ORAL | Status: AC
Start: 2013-10-02 — End: 2013-10-02
  Administered 2013-10-02: 650 mg via ORAL
  Filled 2013-10-02: qty 2

## 2013-10-02 MED ORDER — SULFAMETHOXAZOLE-TRIMETHOPRIM 800-160 MG PO TABS: 1.0000 | ORAL_TABLET | Freq: Two times a day (BID) | ORAL | Status: DC

## 2013-10-02 NOTE — MAU Note (Signed)
Patient states she has had a positive home pregnancy. Has had abdominal cramping. States she has had miscarriages and an ectopic pregnancy and wants to make sure everything is OK. States she has hypertension but is not able to afford her medication. Denies bleeding or discharge.

## 2013-10-02 NOTE — MAU Provider Note (Signed)
History     CSN: [id number]  Arrival date and time: 10/02/13 1233   First Provider Initiated Contact with Patient 10/02/13 1444      Chief Complaint  Patient presents with  . Possible Pregnancy  . Abdominal Cramping   HPI Comments: [name] 38 y.o. [id number] presents to MAU with abdominal cramping x 3 weeks. Denies any bleeding. Has been taking NSAIDs every other day at least. She has not established [facility] care, [facility]. Hx 4 SABs and one ectopic pregnancies. Has hypertension untreated due to inability to afford medications.    Possible Pregnancy Associated symptoms include abdominal pain.  Abdominal Cramping      Past Medical History  Diagnosis Date  . Hypertension   . Bipolar 1 disorder   . History of miscarriage     History reviewed. No pertinent past surgical history.  History reviewed. No pertinent family history.  History  Substance Use Topics  . Smoking status: Current Every Day Smoker  . Smokeless tobacco: Not on file  . Alcohol Use: No    Allergies:  Allergies  Allergen Reactions  . Latex Rash  . Penicillins Rash    Prescriptions prior to admission  Medication Sig Dispense Refill  . diphenhydrAMINE (BENADRYL) 25 mg capsule Take 50 mg by mouth every 6 (six) hours as needed for sleep.       Marland Kitchen. ibuprofen (ADVIL,MOTRIN) 200 MG tablet Take 400 mg by mouth daily. For pain.      Marland Kitchen. omeprazole (PRILOSEC) 20 MG capsule Take 20 mg by mouth daily.      . Prenatal Vit-Fe Fumarate-FA (PRENATAL MULTIVITAMIN) TABS tablet Take 1 tablet by mouth daily at 12 noon.        Review of Systems  Constitutional: Negative.   HENT: Negative.   Eyes: Negative.   Respiratory: Negative.   Cardiovascular: Negative.   Gastrointestinal: Positive for abdominal pain.  Genitourinary: Negative.   Skin: Negative.   Neurological: Negative.   Psychiatric/Behavioral: Negative.    Physical Exam   Blood pressure 152/93, pulse 81, temperature 99 F (37.2  C), temperature source Oral, resp. rate 16, height 5\' 5"  (1.651 m), weight 51.347 kg (113 lb 3.2 oz), last menstrual period 07/29/2013, SpO2 97.00%.  Physical Exam  Constitutional: She is oriented to person, place, and time. She appears well-developed and well-nourished. No distress.  HENT:  Head: Normocephalic and atraumatic.  Eyes: Pupils are equal, round, and reactive to light.  GI: Soft. Bowel sounds are normal. She exhibits no distension. There is tenderness. There is no rebound and no guarding.  Genitourinary:  Genital:external negative Vaginal:small amount white discharge Cervix:closed/ thick Bimanual:slightly tender/ pregnant   Musculoskeletal: Normal range of motion.  Neurological: She is alert and oriented to person, place, and time.  Skin: Skin is warm and dry.  Psychiatric: She has a normal mood and affect. Her behavior is normal. Judgment and thought content normal.   Results for orders placed during the hospital encounter of 10/02/13 (from the past 24 hour(s))  URINALYSIS, ROUTINE W REFLEX MICROSCOPIC     Status: Abnormal   Collection Time    10/02/13 12:54 PM      Result Value Ref Range   Color, Urine YELLOW  YELLOW   APPearance CLEAR  CLEAR   Specific Gravity, Urine 1.025  1.005 - 1.030   pH 5.5  5.0 - 8.0   Glucose, UA NEGATIVE  NEGATIVE mg/dL   Hgb urine dipstick NEGATIVE  NEGATIVE   Bilirubin  Urine NEGATIVE  NEGATIVE   Ketones, ur 15 (*) NEGATIVE mg/dL   Protein, ur NEGATIVE  NEGATIVE mg/dL   Urobilinogen, UA 0.2  0.0 - 1.0 mg/dL   Nitrite POSITIVE (*) NEGATIVE   Leukocytes, UA NEGATIVE  NEGATIVE  URINE MICROSCOPIC-ADD ON     Status: Abnormal   Collection Time    10/02/13 12:54 PM      Result Value Ref Range   Squamous Epithelial / LPF RARE  RARE   WBC, UA 3-6  <3 WBC/hpf   Bacteria, UA FEW (*) RARE  POCT PREGNANCY, URINE     Status: Abnormal   Collection Time    10/02/13 12:58 PM      Result Value Ref Range   Preg Test, Ur POSITIVE (*) NEGATIVE   CBC WITH DIFFERENTIAL     Status: Abnormal   Collection Time    10/02/13  2:56 PM      Result Value Ref Range   WBC 10.7 (*) 4.0 - 10.5 K/uL   RBC 4.37  3.87 - 5.11 MIL/uL   Hemoglobin 13.8  12.0 - 15.0 g/dL   HCT 16.1  09.6 - 04.5 %   MCV 92.7  78.0 - 100.0 fL   MCH 31.6  26.0 - 34.0 pg   MCHC 34.1  30.0 - 36.0 g/dL   RDW 40.9  81.1 - 91.4 %   Platelets 293  150 - 400 K/uL   Neutrophils Relative % 73  43 - 77 %   Neutro Abs 7.9 (*) 1.7 - 7.7 K/uL   Lymphocytes Relative 20  12 - 46 %   Lymphs Abs 2.1  0.7 - 4.0 K/uL   Monocytes Relative 7  3 - 12 %   Monocytes Absolute 0.7  0.1 - 1.0 K/uL   Eosinophils Relative 0  0 - 5 %   Eosinophils Absolute 0.0  0.0 - 0.7 K/uL   Basophils Relative 0  0 - 1 %   Basophils Absolute 0.0  0.0 - 0.1 K/uL  ABO/RH     Status: None   Collection Time    10/02/13  2:56 PM      Result Value Ref Range   ABO/RH(D) O POS    HCG, QUANTITATIVE, PREGNANCY     Status: Abnormal   Collection Time    10/02/13  2:56 PM      Result Value Ref Range   hCG, Beta Chain, [place] Longest [id number] (*) <5 mIU/mL  WET PREP, GENITAL     Status: Abnormal   Collection Time    10/02/13  4:13 PM      Result Value Ref Range   Yeast Wet Prep HPF POC NONE SEEN  NONE SEEN   Trich, Wet Prep NONE SEEN  NONE SEEN   Clue Cells Wet Prep HPF POC FEW (*) NONE SEEN   WBC, Wet Prep HPF POC NONE SEEN  NONE SEEN   US Ob Comp Less 14 Wks  10/02/2013   CLINICAL DATA:  Early pregnancy, lower pelvic pain/cramping, history of ectopic  EXAM: OBSTETRIC <14 WK [place] AND TRANSVAGINAL OB US  TECHNIQUE: Both transabdominal and transvaginal ultrasound examinations were performed for complete evaluation of the gestation as well as the maternal uterus, adnexal regions, and pelvic cul-de-sac. Transvaginal technique was performed to assess early pregnancy.  COMPARISON:  None.  FINDINGS: Intrauterine gestational sac: Visualized/normal in shape.  Yolk sac:  Present  Embryo:  Present  Cardiac Activity: Present  Heart  Rate:  170 bpm  CRL:  22.1  mm   9 w 0 d                  US EDC: 05/07/2014  Maternal uterus/adnexae: Small subchorionic hemorrhage.  Right ovary is within normal limits, noting a corpus luteal cyst.  Left ovary is within normal limits.  No free fluid.  IMPRESSION: Single live intrauterine gestation with estimated gestational age [redacted] weeks 0 days by crown-rump length.   Electronically Signed   By: Charline BillsSriyesh  Krishnan M.D.   On: 10/02/2013 16:22   [place] Ob Transvaginal  10/02/2013   CLINICAL DATA:  Early pregnancy, lower pelvic pain/cramping, history of ectopic  EXAM: OBSTETRIC <14 WK US AND TRANSVAGINAL OB US  TECHNIQUE: Both transabdominal and transvaginal ultrasound examinations were performed for complete evaluation of the gestation as well as the maternal uterus, adnexal regions, and pelvic cul-de-sac. Transvaginal technique was performed to assess early pregnancy.  COMPARISON:  None.  FINDINGS: Intrauterine gestational sac: Visualized/normal in shape.  Yolk sac:  Present  Embryo:  Present  Cardiac Activity: Present  Heart Rate:  170 bpm  CRL:   22.1  mm   9 w 0 d                  US EDC: 05/07/2014  Maternal uterus/adnexae: Small subchorionic hemorrhage.  Right ovary is within normal limits, noting a corpus luteal cyst.  Left ovary is within normal limits.  No free fluid.  IMPRESSION: Single live intrauterine gestation with estimated gestational age [redacted] weeks 0 days by crown-rump length.   Electronically Signed   By: Charline BillsSriyesh  Krishnan M.D.   On: 10/02/2013 16:22     MAU Course  Procedures  MDM Wet prep, GC, Chlamydia, CBC, UA, U/S, ABORh, Quant   Assessment and Plan   A: Abdominal Pain in pregnancy UTI  P: Above orders Advised to hydrate/ establish PNC with [facility] Bactrim DS one po BID x 7 days Stop NSAIDs/ take tylenol only Return to MAU if SX worsen or do not resolve   Carolynn ServeBarefoot, Ed Mandich Miller 10/02/2013, 3:26 PM
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
## 2013-10-03 LAB — GC/CHLAMYDIA PROBE AMP
CT PROBE, AMP APTIMA: NEGATIVE
GC Probe RNA: NEGATIVE

## 2013-10-03 LAB — HIV ANTIBODY (ROUTINE TESTING W REFLEX): HIV 1&2 Ab, 4th Generation: NONREACTIVE

## 2013-10-03 NOTE — MAU Provider Note (Signed)
Attestation of Attending Supervision of Advanced Practitioner (PA/CNM/NP): Evaluation and management procedures were performed by the Advanced Practitioner under my supervision and collaboration.  I have reviewed the Advanced Practitioner's note and chart, and I agree with the management and plan.  Abbie Berling S, MD [facility] Attending 10/03/2013 8:31 AM   

## 2013-11-10 LAB — OB RESULTS CONSOLE GC/CHLAMYDIA
Chlamydia: NEGATIVE
GC PROBE AMP, GENITAL: NEGATIVE

## 2013-12-01 ENCOUNTER — Ambulatory Visit (HOSPITAL_COMMUNITY)
Admission: RE | Admit: 2013-12-01 | Discharge: 2013-12-01 | Disposition: A | Discharge status: Home / Self Care | Payer: Medicaid Other | Source: Ambulatory Visit | Attending: Obstetrics and Gynecology | Admitting: Obstetrics and Gynecology

## 2013-12-01 ENCOUNTER — Encounter (HOSPITAL_COMMUNITY): Payer: Medicaid Other

## 2013-12-01 ENCOUNTER — Encounter (HOSPITAL_COMMUNITY): Payer: Self-pay

## 2013-12-01 VITALS — BP 132/59 | HR 80 | Wt 118.0 lb

## 2013-12-01 DIAGNOSIS — O09529 Supervision of elderly multigravida, unspecified trimester: Secondary | ICD-10-CM | POA: Diagnosis not present

## 2013-12-01 DIAGNOSIS — I10 Essential (primary) hypertension: Secondary | ICD-10-CM

## 2013-12-01 DIAGNOSIS — O10019 Pre-existing essential hypertension complicating pregnancy, unspecified trimester: Secondary | ICD-10-CM | POA: Diagnosis not present

## 2013-12-01 DIAGNOSIS — F319 Bipolar disorder, unspecified: Secondary | ICD-10-CM | POA: Insufficient documentation

## 2013-12-01 DIAGNOSIS — O99340 Other mental disorders complicating pregnancy, unspecified trimester: Secondary | ICD-10-CM | POA: Insufficient documentation

## 2013-12-01 DIAGNOSIS — O99330 Smoking (tobacco) complicating pregnancy, unspecified trimester: Secondary | ICD-10-CM | POA: Insufficient documentation

## 2013-12-01 DIAGNOSIS — F17200 Nicotine dependence, unspecified, uncomplicated: Secondary | ICD-10-CM

## 2013-12-01 NOTE — Consult Note (Signed)
Maternal Fetal Medicine Consultation  Requesting Provider(s): Henreitta Leber, PA-C  Reason for consultation: Bipolar disorder on Latuda and Lamictal  HPI: [name] is a 38 year-old [id number] currently at 17w 6d who was sent for consultation due to a history of Bipolar disorder. She is currently on Latuda 40 mg daily and Lamictal and is followed at [facility].  She was previously on Seroquel and Neurontin which were discontinued in early pregnancy.  She reports that her symptoms have been fairly well controlled on these medications.    [name] past OB history is remarkable for two prior term SVDs, 3 early SABs and an ectopic pregnancy.  Her last delivery was complicated by Cholestasis of pregnancy and lagging fetal growth.  The patient also reports a history of chronic hypertension, but has never required medications to date.  [name] also reports that she drank "a pint of Tequila" during early pregnancy - stopped drinking once she found out that she was pregnant.  She also reports smoking 1/2 ppd.  She is currently without complaints.    OB History: OB History   Grav Para Term Preterm Abortions TAB SAB Ect Mult Living   PMH:  Past Medical History  Diagnosis Date  . Hypertension   . Bipolar 1 disorder   . History of miscarriage     PSH: History reviewed. No pertinent past surgical history.  Meds:  Current Outpatient Prescriptions on File Prior to Encounter  Medication Sig Dispense Refill  . diphenhydrAMINE (BENADRYL) 25 mg capsule Take 50 mg by mouth every 6 (six) hours as needed for sleep.       Marland Kitchen omeprazole (PRILOSEC) 20 MG capsule Take 20 mg by mouth daily.      . Prenatal Vit-Fe Fumarate-FA (PRENATAL MULTIVITAMIN) TABS tablet Take 1 tablet by mouth daily at 12 noon.      . sulfamethoxazole-trimethoprim (SEPTRA DS) 800-160 MG per tablet Take 1 tablet by mouth every 12 (twelve) hours.  14 tablet  0   No current facility-administered  medications on file prior to encounter.   Latuda 40 mg daily Lamictal Diclegis  Allergies:  Allergies  Allergen Reactions  . Latex Rash  . Penicillins Rash   FH: History reviewed. No pertinent family history.  Denies family history of birth defects or hereditary disorders  Soc:  History   Social History  . Marital Status: Divorced    Spouse Name: N/A    Number of Children: N/A  . Years of Education: N/A   Occupational History  . Not on file.   Social History Main Topics  . Smoking status: Current Every Day Smoker  . Smokeless tobacco: Not on file  . Alcohol Use: No  . Drug Use: No  . Sexual Activity: Yes   Other Topics Concern  . Not on file   Social History Narrative  . No narrative on file    PE:   Filed Vitals:   12/01/13 1614  BP: 132/59  Pulse: 80    A/P: 1) Single IUP at 17w 6d         2) Advanced maternal age - briefly discussed increased risk for aneuploidy based on maternal age.  The patient reports that she had blood work done, but has not received results (? Cell free fetal DNA).  If interested, we would be happy to arrange for her to meet with our Genetic counselors.  Please contact  our office if desired.         3) Bipolar disorder - currently on Lamictal and Latuda.  An increased risk of oral clefts was suspected after Lamictal exposure during pregnancy in one pregnancy registry, but not confirmed in other larger registries.  [name] is not felt to increase risks for birth defects, but limited human data is available.  Feel that both of these medications are relatively safe in pregnancy and that the benefits outweigh the risks.  The patient reports that she is scheduled for an anatomy scan at [facility] next week.  If desired, we would be happy to arrange a comprehensive ultrasound in our unit.  Please contact our office if desired.  Recommend that the patient continue follow up with her Mental Health Specialist [name]).         4) Hx of  alcohol exposure in the first trimester - briefly discussed risks to the fetus.  Alcohol use may be associated with a higher risk for cardiac defects.  A fetal echo was tentatively scheduled at 20-[redacted] weeks gestation.         5) CHTN - not on medications         6) Hx of cholestasis in previous pregnancy  - the patient is aware that she may be at increased risk for recurrence. We briefly discussed treatment and management in the event that this recurs.         7) Hx of fetal growth restriction / lagging growth with previous pregnancy       Recommendations: 1) Fetal echo scheduled 2) BPs currently stable.  Would offer treatment for SBP > 140 or DBP > 90.  My first line choice of agents would either be Procardia XL or Labetalol.  Would consider delivery at 38-39 weeks if the patient meets criteria for chronic hypertension in pregnancy 3) Given history of CHTN as well as lagging fetal growth with previous pregnancy, would recommend serial growth scans in the 3rd trimester.  Would begin antepartum fetal testing at 32 weeks if growth lag is noted.  Thank you for the opportunity to be a part of the care of [name]. Please contact our office if we can be of further assistance.   I spent approximately 30 minutes with this patient with over 50% of time spent in face-to-face counseling.  Alpha Gula, MD Maternal Fetal Medicine

## 2014-01-29 ENCOUNTER — Encounter (HOSPITAL_COMMUNITY): Payer: Self-pay

## 2014-03-10 ENCOUNTER — Inpatient Hospital Stay (HOSPITAL_COMMUNITY)
Admission: AD | Admit: 2014-03-10 | Discharge: 2014-03-10 | Disposition: A | Discharge status: Home / Self Care | Payer: Medicaid Other | Source: Ambulatory Visit | Attending: Obstetrics and Gynecology | Admitting: Obstetrics and Gynecology

## 2014-03-10 ENCOUNTER — Encounter (HOSPITAL_COMMUNITY): Payer: Self-pay | Admitting: *Deleted

## 2014-03-10 DIAGNOSIS — M791 Myalgia, unspecified site: Secondary | ICD-10-CM

## 2014-03-10 DIAGNOSIS — Z3A32 32 weeks gestation of pregnancy: Secondary | ICD-10-CM | POA: Insufficient documentation

## 2014-03-10 DIAGNOSIS — O9989 Other specified diseases and conditions complicating pregnancy, childbirth and the puerperium: Secondary | ICD-10-CM | POA: Diagnosis present

## 2014-03-10 DIAGNOSIS — Z880 Allergy status to penicillin: Secondary | ICD-10-CM | POA: Insufficient documentation

## 2014-03-10 DIAGNOSIS — Z91040 Latex allergy status: Secondary | ICD-10-CM | POA: Insufficient documentation

## 2014-03-10 LAB — URINALYSIS, ROUTINE W REFLEX MICROSCOPIC
BILIRUBIN URINE: NEGATIVE
GLUCOSE, UA: NEGATIVE mg/dL
HGB URINE DIPSTICK: NEGATIVE
KETONES UR: NEGATIVE mg/dL
Leukocytes, UA: NEGATIVE
Nitrite: NEGATIVE
PH: 6.5 (ref 5.0–8.0)
Protein, ur: NEGATIVE mg/dL
Specific Gravity, Urine: 1.01 (ref 1.005–1.030)
Urobilinogen, UA: 0.2 mg/dL (ref 0.0–1.0)

## 2014-03-10 MED ORDER — CYCLOBENZAPRINE HCL 5 MG PO TABS: 5.0000 mg | ORAL_TABLET | Freq: Three times a day (TID) | ORAL | Status: DC | PRN

## 2014-03-10 NOTE — MAU Note (Signed)
Patient presents with complaints of abdominal and side pain X 2 days.

## 2014-03-10 NOTE — Discharge Instructions (Signed)

## 2014-03-10 NOTE — MAU Note (Signed)
History    [name] is a 38 y.o. [id number] who presents, unannounced, complaining of left sided pain, which she thinks is a kidney infection.  Patient reports pain started two days ago and has been intermittent.  Patient denies issues with urination or GI concerns.  Patient states she took 3 acetaminophen prior to arrival and has had no relief.  Patient denies LoF, VB, and ctx, while reporting active fetus.  Patient states she has not done any strenuous activities and has been "taking it easy."  Patient reports adequate hydration, but states she frequents soda and coffee rather than water.  Patient also reports adequate nutrition.    Patient Active Problem List   Diagnosis Date Noted  . Pregnancy 10/02/2013  . Bipolar 1 disorder 10/02/2013  . HTN (hypertension) 10/02/2013  . Smoker 10/02/2013    Chief Complaint  Patient presents with  . Abdominal Pain  . Flank Pain   HPI  OB History    Gravida Para Term Preterm AB TAB SAB Ectopic Multiple Living   7 2   4  3 1  2       Past Medical History  Diagnosis Date  . Hypertension   . Bipolar 1 disorder   . History of miscarriage     Past Surgical History  Procedure Laterality Date  . No past surgeries      History reviewed. No pertinent family history.  History  Substance Use Topics  . Smoking status: Current Every Day Smoker  . Smokeless tobacco: Never Used  . Alcohol Use: No    Allergies:  Allergies  Allergen Reactions  . Latex Rash  . Penicillins Rash    Prescriptions prior to admission  Medication Sig Dispense Refill Last Dose  . acetaminophen (TYLENOL) 500 MG tablet Take 1,000 mg by mouth every 6 (six) hours as needed for mild pain.   03/10/2014 at Unknown time  . doxylamine, Sleep, (UNISOM) 25 MG tablet Take 25 mg by mouth at bedtime as needed.   03/09/2014 at Unknown time  . lurasidone (LATUDA) 40 MG TABS tablet Take 40 mg by mouth daily with breakfast.     . omeprazole (PRILOSEC) 20 MG capsule Take 20 mg  by mouth daily.   03/10/2014 at Unknown time  . Prenatal Vit-Fe Fumarate-FA (PRENATAL MULTIVITAMIN) TABS tablet Take 1 tablet by mouth daily at 12 noon.   Taking  . sulfamethoxazole-trimethoprim (SEPTRA DS) 800-160 MG per tablet Take 1 tablet by mouth every 12 (twelve) hours. (Patient not taking: Reported on 03/10/2014) 14 tablet 0 Taking    Review of Systems  Constitutional: Negative for fever and chills.  Gastrointestinal: Negative for nausea, vomiting, abdominal pain, diarrhea and constipation.  Genitourinary: Positive for flank pain. Negative for dysuria, urgency, frequency and hematuria.  Musculoskeletal: Positive for myalgias and back pain.  All other systems reviewed and are negative.   See HPI Above Physical Exam   Blood pressure 116/70, pulse 64, temperature 98.1 F (36.7 C), temperature source Oral, resp. rate 16, height 5' 6.5" (1.689 m), weight 123 lb 2 oz (55.849 kg), last menstrual period 07/29/2013.  Results for orders placed or performed during the hospital encounter of 03/10/14 (from the past 24 hour(s))  Urinalysis, Routine w reflex microscopic     Status: None   Collection Time: 03/10/14  9:10 AM  Result Value Ref Range   Color, Urine YELLOW YELLOW   APPearance CLEAR CLEAR   Specific Gravity, Urine 1.010 1.005 - 1.030   pH 6.5 5.0 -  8.0   Glucose, UA NEGATIVE NEGATIVE mg/dL   Hgb urine dipstick NEGATIVE NEGATIVE   Bilirubin Urine NEGATIVE NEGATIVE   Ketones, ur NEGATIVE NEGATIVE mg/dL   Protein, ur NEGATIVE NEGATIVE mg/dL   Urobilinogen, UA 0.2 0.0 - 1.0 mg/dL   Nitrite NEGATIVE NEGATIVE   Leukocytes, UA NEGATIVE NEGATIVE     Physical Exam  Constitutional: She is oriented to person, place, and time.  Cardiovascular: Normal rate, regular rhythm and normal heart sounds.   GI: Soft. Bowel sounds are normal. She exhibits no distension. There is no tenderness. There is no CVA tenderness.  Gravid--fundal height appears AGA, Soft, NT  Genitourinary:  Deferred   Musculoskeletal: Normal range of motion.  Neurological: She is alert and oriented to person, place, and time.  Skin: Skin is warm and dry.   FHR: 145 bpm, Mod Var, -Decels, +Accels UC: One mild contraction graphed, None Palpated ED Course  Assessment: IUP at 32 wks Cat I FT Muscle Pain  Plan: -PE as above -UA: Negative -Findings discussed with patient and informed that pain is most likely muscle pain and not kidney infection -Discussed and educated on muscle pain/strain related to pregnancy and improper body mechanics -RX for Flexeril 5-10mg  Q8HR PRN #30, RF 0 -Bleeding and PTL Precautions -Keep appt as scheduled: Mar 15, 2014 -Encouraged to call if any questions or concerns arise prior to next scheduled office visit.   [name], Lindsey Banko LYNN MSN, CNM 03/10/2014 10:11 AM

## 2014-03-30 NOTE — L&D Delivery Note (Signed)
[id number]: Call from nurse reporting patient feeling urge to push after being placed in high fowlers position.  Patient instructed on pushing techniques and delivered as below.  Infant with reassuring FHT throughout pushing phase.    Delivery Note At 9:22 AM, on Apr 12, 2014, a viable female "Dian Situathanial Vance" was delivered via Vaginal, Spontaneous Delivery (Presentation: ; Occiput Posterior).  Infant noted to have nuchal x 1 with compound presentation of right hand; infant shoulders delivered easily through via somersault maneuver.  Infant with good tone and spontaneous cry and tactile stimulation and bulb suction given by provider, before infant placed upon mother's abdomen.  Nurse continued tactile stimulation and infant APGARs: 8, 9.  Cord clamped, cut, and blood collected. Placenta delivered spontaneously and noted to be intact with 3VC upon inspection.  Placenta sent to pathology for maternal history of smoking, CHTN, and infant issues of IUGR and Oligohydramnios.  Bleeding noted to be moderate despite firm fundus and manual exam removed ~21700mL of clots.  Patient tolerated procedure well and although bleeding small, cytotec 1000mg  placed PR for prophylaxis.  Vaginal inspection revealed bilateral periurethral lacerations, with the right extending into the labial tissue.  All repairs completed and no extra anesthesia necessary.   Fundus firm, at the umbilicus, and bleeding small.  Mother hemodynamically stable and infant skin to skin prior to provider exit.  Mother to have BTL tomorrow as papers are valid.  Patient opts to pump and feed infant breast milk.  Patient wishes for infant to be circumcised in outpatient setting. Infant weight at one hour of life: 5lbs 0.2oz, 17.25in.  Anesthesia: Epidural  Episiotomy: None Lacerations: Periurethral Suture Repair: 3.0 vicryl Est. Blood Loss (mL):  400  Mom to postpartum.  Baby to Couplet care / Skin to Skin.  Wiletta Bermingham LYNN, MSN, CNM 04/12/2014, 10:10  AM

## 2014-04-11 ENCOUNTER — Encounter: Payer: Self-pay | Admitting: Obstetrics and Gynecology

## 2014-04-11 ENCOUNTER — Encounter (HOSPITAL_COMMUNITY): Payer: Self-pay | Admitting: *Deleted

## 2014-04-11 ENCOUNTER — Inpatient Hospital Stay (HOSPITAL_COMMUNITY)
Admission: AD | Admit: 2014-04-11 | Discharge: 2014-04-14 | DRG: 767 | Disposition: A | Discharge status: Home / Self Care | Payer: Medicaid Other | Source: Ambulatory Visit | Attending: Obstetrics and Gynecology | Admitting: Obstetrics and Gynecology

## 2014-04-11 DIAGNOSIS — Z881 Allergy status to other antibiotic agents status: Secondary | ICD-10-CM

## 2014-04-11 DIAGNOSIS — O99334 Smoking (tobacco) complicating childbirth: Secondary | ICD-10-CM | POA: Diagnosis present

## 2014-04-11 DIAGNOSIS — O09299 Supervision of pregnancy with other poor reproductive or obstetric history, unspecified trimester: Secondary | ICD-10-CM

## 2014-04-11 DIAGNOSIS — Z91040 Latex allergy status: Secondary | ICD-10-CM | POA: Diagnosis not present

## 2014-04-11 DIAGNOSIS — O1092 Unspecified pre-existing hypertension complicating childbirth: Secondary | ICD-10-CM | POA: Diagnosis present

## 2014-04-11 DIAGNOSIS — O99313 Alcohol use complicating pregnancy, third trimester: Secondary | ICD-10-CM | POA: Diagnosis present

## 2014-04-11 DIAGNOSIS — D649 Anemia, unspecified: Secondary | ICD-10-CM | POA: Diagnosis present

## 2014-04-11 DIAGNOSIS — O99344 Other mental disorders complicating childbirth: Secondary | ICD-10-CM | POA: Diagnosis present

## 2014-04-11 DIAGNOSIS — F1290 Cannabis use, unspecified, uncomplicated: Secondary | ICD-10-CM | POA: Diagnosis present

## 2014-04-11 DIAGNOSIS — Z302 Encounter for sterilization: Secondary | ICD-10-CM | POA: Diagnosis not present

## 2014-04-11 DIAGNOSIS — O09523 Supervision of elderly multigravida, third trimester: Secondary | ICD-10-CM | POA: Diagnosis not present

## 2014-04-11 DIAGNOSIS — O99323 Drug use complicating pregnancy, third trimester: Secondary | ICD-10-CM | POA: Diagnosis present

## 2014-04-11 DIAGNOSIS — O365930 Maternal care for other known or suspected poor fetal growth, third trimester, not applicable or unspecified: Secondary | ICD-10-CM | POA: Diagnosis present

## 2014-04-11 DIAGNOSIS — Z880 Allergy status to penicillin: Secondary | ICD-10-CM | POA: Insufficient documentation

## 2014-04-11 DIAGNOSIS — Z7289 Other problems related to lifestyle: Secondary | ICD-10-CM | POA: Insufficient documentation

## 2014-04-11 DIAGNOSIS — Z3A36 36 weeks gestation of pregnancy: Secondary | ICD-10-CM | POA: Diagnosis present

## 2014-04-11 DIAGNOSIS — IMO0002 Reserved for concepts with insufficient information to code with codable children: Secondary | ICD-10-CM | POA: Diagnosis present

## 2014-04-11 DIAGNOSIS — O0933 Supervision of pregnancy with insufficient antenatal care, third trimester: Secondary | ICD-10-CM | POA: Diagnosis not present

## 2014-04-11 DIAGNOSIS — O4103X0 Oligohydramnios, third trimester, not applicable or unspecified: Secondary | ICD-10-CM | POA: Diagnosis not present

## 2014-04-11 DIAGNOSIS — F1099 Alcohol use, unspecified with unspecified alcohol-induced disorder: Secondary | ICD-10-CM | POA: Diagnosis present

## 2014-04-11 DIAGNOSIS — Z87898 Personal history of other specified conditions: Secondary | ICD-10-CM | POA: Insufficient documentation

## 2014-04-11 DIAGNOSIS — Z789 Other specified health status: Secondary | ICD-10-CM | POA: Insufficient documentation

## 2014-04-11 DIAGNOSIS — O2610 Low weight gain in pregnancy, unspecified trimester: Secondary | ICD-10-CM

## 2014-04-11 DIAGNOSIS — F1910 Other psychoactive substance abuse, uncomplicated: Secondary | ICD-10-CM | POA: Insufficient documentation

## 2014-04-11 DIAGNOSIS — O2613 Low weight gain in pregnancy, third trimester: Secondary | ICD-10-CM | POA: Diagnosis present

## 2014-04-11 DIAGNOSIS — F17200 Nicotine dependence, unspecified, uncomplicated: Secondary | ICD-10-CM | POA: Diagnosis present

## 2014-04-11 DIAGNOSIS — O9081 Anemia of the puerperium: Secondary | ICD-10-CM | POA: Diagnosis present

## 2014-04-11 DIAGNOSIS — F319 Bipolar disorder, unspecified: Secondary | ICD-10-CM | POA: Diagnosis present

## 2014-04-11 LAB — RAPID URINE DRUG SCREEN, HOSP PERFORMED
Amphetamines: NOT DETECTED
BARBITURATES: NOT DETECTED
Benzodiazepines: NOT DETECTED
Cocaine: NOT DETECTED
OPIATES: NOT DETECTED
TETRAHYDROCANNABINOL: NOT DETECTED

## 2014-04-11 LAB — TYPE AND SCREEN
ABO/RH(D): O POS
ANTIBODY SCREEN: NEGATIVE

## 2014-04-11 LAB — LACTATE DEHYDROGENASE: LDH: 124 U/L (ref 94–250)

## 2014-04-11 LAB — PROTEIN / CREATININE RATIO, URINE
CREATININE, URINE: 60 mg/dL
Protein Creatinine Ratio: 0.13 (ref 0.00–0.15)
Total Protein, Urine: 8 mg/dL

## 2014-04-11 LAB — CBC
HEMATOCRIT: 33.1 % — AB (ref 36.0–46.0)
HEMOGLOBIN: 11.4 g/dL — AB (ref 12.0–15.0)
MCH: 30.8 pg (ref 26.0–34.0)
MCHC: 34.4 g/dL (ref 30.0–36.0)
MCV: 89.5 fL (ref 78.0–100.0)
Platelets: 312 10*3/uL (ref 150–400)
RBC: 3.7 MIL/uL — AB (ref 3.87–5.11)
RDW: 12.8 % (ref 11.5–15.5)
WBC: 11.1 10*3/uL — AB (ref 4.0–10.5)

## 2014-04-11 LAB — COMPREHENSIVE METABOLIC PANEL
ALBUMIN: 2.7 g/dL — AB (ref 3.5–5.2)
ALT: 35 U/L (ref 0–35)
AST: 31 U/L (ref 0–37)
Alkaline Phosphatase: 285 U/L — ABNORMAL HIGH (ref 39–117)
Anion gap: 8 (ref 5–15)
BUN: 7 mg/dL (ref 6–23)
CO2: 19 mmol/L (ref 19–32)
Calcium: 8.3 mg/dL — ABNORMAL LOW (ref 8.4–10.5)
Chloride: 107 mEq/L (ref 96–112)
Creatinine, Ser: 0.64 mg/dL (ref 0.50–1.10)
GFR calc non Af Amer: >90 mL/min (ref >90)
GLUCOSE: 94 mg/dL (ref 70–99)
POTASSIUM: 3.8 mmol/L (ref 3.5–5.1)
Sodium: 134 mmol/L — ABNORMAL LOW (ref 135–145)
TOTAL PROTEIN: 6.1 g/dL (ref 6.0–8.3)
Total Bilirubin: 0.4 mg/dL (ref 0.3–1.2)

## 2014-04-11 LAB — URINALYSIS, ROUTINE W REFLEX MICROSCOPIC
BILIRUBIN URINE: NEGATIVE
Glucose, UA: NEGATIVE mg/dL
HGB URINE DIPSTICK: NEGATIVE
KETONES UR: NEGATIVE mg/dL
Leukocytes, UA: NEGATIVE
Nitrite: NEGATIVE
PH: 5.5 (ref 5.0–8.0)
Protein, ur: NEGATIVE mg/dL
SPECIFIC GRAVITY, URINE: 1.02 (ref 1.005–1.030)
Urobilinogen, UA: 0.2 mg/dL (ref 0.0–1.0)

## 2014-04-11 LAB — GROUP B STREP BY PCR: Group B strep by PCR: NEGATIVE

## 2014-04-11 LAB — URIC ACID: Uric Acid, Serum: 5.7 mg/dL (ref 2.4–7.0)

## 2014-04-11 LAB — OB RESULTS CONSOLE GBS: STREP GROUP B AG: NEGATIVE

## 2014-04-11 MED ORDER — LURASIDONE HCL 40 MG PO TABS
40.0000 mg | ORAL_TABLET | Freq: Every day | ORAL | Status: DC
  Filled 2014-04-11: qty 1

## 2014-04-11 MED ORDER — TERBUTALINE SULFATE 1 MG/ML IJ SOLN: 0.2500 mg | Freq: Once | INTRAMUSCULAR | Status: AC | PRN

## 2014-04-11 MED ORDER — PHENYLEPHRINE 40 MCG/ML (10ML) SYRINGE FOR IV PUSH (FOR BLOOD PRESSURE SUPPORT)
80.0000 ug | PREFILLED_SYRINGE | INTRAVENOUS | Status: DC | PRN
  Filled 2014-04-11: qty 2

## 2014-04-11 MED ORDER — CITRIC ACID-SODIUM CITRATE 334-500 MG/5ML PO SOLN: 30.0000 mL | ORAL | Status: DC | PRN

## 2014-04-11 MED ORDER — OXYTOCIN 40 UNITS IN LACTATED RINGERS INFUSION - SIMPLE MED: 62.5000 mL/h | INTRAVENOUS | Status: DC

## 2014-04-11 MED ORDER — ONDANSETRON HCL 4 MG/2ML IJ SOLN
4.0000 mg | Freq: Four times a day (QID) | INTRAMUSCULAR | Status: DC | PRN
Start: 2014-04-11 — End: 2014-04-12
  Administered 2014-04-12: 4 mg via INTRAVENOUS
  Filled 2014-04-11: qty 2

## 2014-04-11 MED ORDER — ACETAMINOPHEN 325 MG PO TABS: 650.0000 mg | ORAL_TABLET | ORAL | Status: DC | PRN

## 2014-04-11 MED ORDER — OXYTOCIN BOLUS FROM INFUSION
500.0000 mL | INTRAVENOUS | Status: DC
  Administered 2014-04-12: 500 mL via INTRAVENOUS

## 2014-04-11 MED ORDER — OXYCODONE-ACETAMINOPHEN 5-325 MG PO TABS: 1.0000 | ORAL_TABLET | ORAL | Status: DC | PRN

## 2014-04-11 MED ORDER — OXYTOCIN 40 UNITS IN LACTATED RINGERS INFUSION - SIMPLE MED
1.0000 m[IU]/min | INTRAVENOUS | Status: DC
  Administered 2014-04-11: 1 m[IU]/min via INTRAVENOUS
  Filled 2014-04-11: qty 1000

## 2014-04-11 MED ORDER — BUTORPHANOL TARTRATE 1 MG/ML IJ SOLN
1.0000 mg | INTRAMUSCULAR | Status: DC | PRN
  Administered 2014-04-11 (×2): 1 mg via INTRAVENOUS
  Filled 2014-04-11 (×2): qty 1

## 2014-04-11 MED ORDER — LACTATED RINGERS IV SOLN: 500.0000 mL | INTRAVENOUS | Status: DC | PRN

## 2014-04-11 MED ORDER — MISOPROSTOL 25 MCG QUARTER TABLET
25.0000 ug | ORAL_TABLET | ORAL | Status: DC | PRN
  Filled 2014-04-11: qty 1

## 2014-04-11 MED ORDER — LIDOCAINE HCL (PF) 1 % IJ SOLN
30.0000 mL | INTRAMUSCULAR | Status: DC | PRN
  Filled 2014-04-11: qty 30

## 2014-04-11 MED ORDER — ZOLPIDEM TARTRATE 5 MG PO TABS: 5.0000 mg | ORAL_TABLET | Freq: Every evening | ORAL | Status: DC | PRN

## 2014-04-11 MED ORDER — EPHEDRINE 5 MG/ML INJ
10.0000 mg | INTRAVENOUS | Status: DC | PRN
  Filled 2014-04-11: qty 2

## 2014-04-11 MED ORDER — FLEET ENEMA 7-19 GM/118ML RE ENEM
1.0000 | ENEMA | RECTAL | Status: DC | PRN
Start: 2014-04-11 — End: 2014-04-12

## 2014-04-11 MED ORDER — DIPHENHYDRAMINE HCL 50 MG/ML IJ SOLN
12.5000 mg | INTRAMUSCULAR | Status: DC | PRN
  Administered 2014-04-12 (×2): 12.5 mg via INTRAVENOUS
  Filled 2014-04-11 (×2): qty 1

## 2014-04-11 MED ORDER — FENTANYL 2.5 MCG/ML BUPIVACAINE 1/10 % EPIDURAL INFUSION (WH - ANES)
14.0000 mL/h | INTRAMUSCULAR | Status: DC | PRN
Start: 2014-04-11 — End: 2014-04-12
  Administered 2014-04-12: 14 mL/h via EPIDURAL
  Filled 2014-04-11: qty 125

## 2014-04-11 MED ORDER — LACTATED RINGERS IV SOLN
500.0000 mL | Freq: Once | INTRAVENOUS | Status: AC
  Administered 2014-04-12: 500 mL via INTRAVENOUS

## 2014-04-11 MED ORDER — PHENYLEPHRINE 40 MCG/ML (10ML) SYRINGE FOR IV PUSH (FOR BLOOD PRESSURE SUPPORT)
80.0000 ug | PREFILLED_SYRINGE | INTRAVENOUS | Status: DC | PRN
  Filled 2014-04-11: qty 20
  Filled 2014-04-11: qty 2

## 2014-04-11 MED ORDER — PANTOPRAZOLE SODIUM 40 MG PO TBEC
40.0000 mg | DELAYED_RELEASE_TABLET | Freq: Every day | ORAL | Status: DC
  Administered 2014-04-11: 40 mg via ORAL
  Filled 2014-04-11: qty 1

## 2014-04-11 MED ORDER — LACTATED RINGERS IV SOLN
INTRAVENOUS | Status: DC
  Administered 2014-04-11 – 2014-04-12 (×4): via INTRAVENOUS

## 2014-04-11 MED ORDER — OXYCODONE-ACETAMINOPHEN 5-325 MG PO TABS: 2.0000 | ORAL_TABLET | ORAL | Status: DC | PRN

## 2014-04-11 NOTE — Progress Notes (Signed)
Subjective: Received report and assumed care of this 39 yo [id number] @ 36.2 wks by 9-wk sono (EDD 05/07/14) wks admitted this evening per MFM recs due to 1) IUGR (EFW 4lbs 9 ozs; <3rd%tile), 2) Oligo (AFI 4.45 cm; 3rd%) and 3) Abnormal dopplers w/o absent or reverse end diastolic flow. BPP 8/8 today.  Has had one dose of Stadol w/ good relief; planning epidural.  History and fetal strip reviewed.  Denies h/a, visual disturbances, RUQ pain, CP, SOB, weakness, N/V or LOF. Reports active fetus.  Objective: BP 136/90 mmHg  Pulse 66  Temp(Src) 97.7 F (36.5 C) (Axillary)  Resp 16  Ht  (1.676 m)  Wt 129 lb (58.514 kg)  BMI 20.83 kg/m2  LMP 07/29/2013      Filed Vitals:   04/11/14 1901 04/11/14 1931 04/11/14 2000 04/11/14 2030  BP: 136/84 143/81 152/89 136/90  Pulse: 76 63 66 66  Temp:   97.7 F (36.5 C)   TempSrc:   Axillary   Resp: Height:      Weight:       Results for orders placed or performed during the hospital encounter of 04/11/14 (from the past 24 hour(s))  OB RESULT CONSOLE Group B Strep     Status: None   Collection Time: 04/11/14 12:00 AM  Result Value Ref Range   GBS Negative   Urinalysis, Routine w reflex microscopic     Status: None   Collection Time: 04/11/14 12:50 PM  Result Value Ref Range   Color, Urine YELLOW YELLOW   APPearance CLEAR CLEAR   Specific Gravity, Urine 1.020 1.005 - 1.030   pH 5.5 5.0 - 8.0   Glucose, UA NEGATIVE NEGATIVE mg/dL   Hgb urine dipstick NEGATIVE NEGATIVE   Bilirubin Urine NEGATIVE NEGATIVE   Ketones, ur NEGATIVE NEGATIVE mg/dL   Protein, ur NEGATIVE NEGATIVE mg/dL   Urobilinogen, UA 0.2 0.0 - 1.0 mg/dL   Nitrite NEGATIVE NEGATIVE   Leukocytes, UA NEGATIVE NEGATIVE  Protein / creatinine ratio, urine     Status: None   Collection Time: 04/11/14 12:50 PM  Result Value Ref Range   Creatinine, Urine 60.00 mg/dL   Total Protein, Urine 8 mg/dL   Protein Creatinine Ratio 0.13 0.00 - 0.15  Drug screen panel,  emergency     Status: None   Collection Time: 04/11/14 12:50 PM  Result Value Ref Range   Opiates NONE DETECTED NONE DETECTED   Cocaine NONE DETECTED NONE DETECTED   Benzodiazepines NONE DETECTED NONE DETECTED   Amphetamines NONE DETECTED NONE DETECTED   Tetrahydrocannabinol NONE DETECTED NONE DETECTED   Barbiturates NONE DETECTED NONE DETECTED  CBC     Status: Abnormal   Collection Time: 04/11/14  2:40 PM  Result Value Ref Range   WBC 11.1 (H) 4.0 - 10.5 K/uL   RBC 3.70 (L) 3.87 - 5.11 MIL/uL   Hemoglobin 11.4 (L) 12.0 - 15.0 g/dL   HCT 45.4 (L) 09.8 - 11.9 %   MCV 89.5 78.0 - 100.0 fL   MCH 30.8 26.0 - 34.0 pg   MCHC 34.4 30.0 - 36.0 g/dL   RDW 14.7 82.9 - 56.2 %   Platelets 312 150 - 400 K/uL  Comprehensive metabolic panel     Status: Abnormal   Collection Time: 04/11/14  2:40 PM  Result Value Ref Range   Sodium 134 (L) 135 - 145 mmol/L   Potassium 3.8 3.5 - 5.1 mmol/L   Chloride 107 96 - 112 mEq/L  CO2 19 19 - 32 mmol/L   Glucose, Bld 94 70 - 99 mg/dL   BUN 7 6 - 23 mg/dL   Creatinine, Ser 3.080.64 0.50 - 1.10 mg/dL   Calcium 8.3 (L) 8.4 - 10.5 mg/dL   Total Protein 6.1 6.0 - 8.3 g/dL   Albumin 2.7 (L) 3.5 - 5.2 g/dL   AST 31 0 - 37 U/L   ALT 35 0 - 35 U/L   Alkaline Phosphatase 285 (H) 39 - 117 U/L   Total Bilirubin 0.4 0.3 - 1.2 mg/dL   GFR calc non Af Amer >90 >90 mL/min   GFR calc Af Amer >90 >90 mL/min   Anion gap 8 5 - 15  Lactate dehydrogenase     Status: None   Collection Time: 04/11/14  2:40 PM  Result Value Ref Range   LDH 124 94 - 250 U/L  Uric acid     Status: None   Collection Time: 04/11/14  2:40 PM  Result Value Ref Range   Uric Acid, Serum 5.7 2.4 - 7.0 mg/dL  Type and screen     Status: None   Collection Time: 04/11/14  2:40 PM  Result Value Ref Range   ABO/RH(D) O POS    Antibody Screen NEG    Sample Expiration 04/14/2014   Group B strep by PCR     Status: None   Collection Time: 04/11/14  3:45 PM  Result Value Ref Range   Group B strep by  PCR NEGATIVE NEGATIVE    Gen: NAD Lungs: CTAB CV: RRR w/o murmur Ext: 2+ DTRs bilaterally, no clonus, no edema FHT: BL 130 w/ moderate variability, +accels, no decels UC:   regular, every 5 minutes SVE: Deferred.     Pitocin at 3 mius/min  U/S on 04/11/14: SIUP, vtx, posterior placenta, AFI 3rd%, S/D ratio 1.82, BPD, femur and abdomen all < 3%. HC 5th%, EFW 4lbs 9 oz (2070 g) = <3.0%.  Bishop score: 8  Intracervical balloon placed by previous provider at 15:23 PM; bloody show appreciated; bulb still in place.   Assessment:  IUP at 36.2 wks IOL due to IUGR, oligo and abnormal dopplers CHTN; no meds; no concerns for preeclampsia at this time H/O preeclampsia in prior pregnancy Cat 1 FHRT Bipolar illness; currently on daily Latuda H/O depression GBS neg UDS neg AMA Desires BTL prior to discharge (papers signed on 03/01/14) Multiple stressors H/O ETOH and marijuana use in early pregnancy and was taking Lamictal and Latuda for Bipolar dx - fetal echo WNL Smoker Insufficient weight gain (only gained ~ 11 lbs this pregnancy)   Plan: Continue w/ current plan Consult prn Monitor maternal/fetal status closely SWS consult post delivery Expect progress and SVD  Sherre ScarletWILLIAMS, Deryl Ports CNM 04/11/2014, 08:35 PM

## 2014-04-11 NOTE — Progress Notes (Addendum)
  Subjective: Now on [facility] Suite--patient's father and friend in to visit.  Patient advises she will return to current living situation after delivery--"good situation", with father and female friend.  Patient's other children live with her mother in [place]--patient does see them.  Objective: BP 139/83 mmHg  Pulse 69  Temp(Src) 98.1 F (36.7 C) (Oral)  Resp 18  Ht 5\' 6"  (1.676 m)  Wt 129 lb (58.514 kg)  BMI 20.83 kg/m2  LMP 07/29/2013      Filed Vitals:   04/11/14 1429 04/11/14 1447 04/11/14 1523 04/11/14 1551  BP:  132/80 135/75 139/83  Pulse:  81 83 69  Temp:  98.1 F (36.7 C)  98.2 F (36.8 C)  TempSrc:  Oral  Oral  Resp:  18 18 18   Height: 5\' 6"  (1.676 m)     Weight: 129 lb (58.514 kg)        FHT: Category 1 UC:   None SVE:    1 cm, 50%, vtx, -2, mid-position Foley bulb inserted without difficulty--placed to traction  PIH labs and PCR pending  Assessment:  Induction of labor--oligo, IUGR, abnormal dopplers Unknown GBS--PCR pending Hx drug/etoh use in early pregnancy Desires BTL Latex/PCN allergies Chronic hypertension Bipolar dx.  Plan: Start pitocin per low-dose protocol. Await extrusion of foley. Drug screen pending. Pain med/epidural prn. If labor ensues prior to GBS result, will start Ancef for unknown GBS prophylaxis. SW consult after delivery   Nigel BridgemanLATHAM, Skylar Priest CNM 04/11/2014, 3:54 PM

## 2014-04-11 NOTE — MAU Note (Signed)
Sent from OB's office for further evaluation for IUGR;

## 2014-04-11 NOTE — Progress Notes (Signed)
Subjective: "Just felt first contraction".  Doing well, no issues.   Objective: BP 139/83 mmHg  Pulse 69  Temp(Src) 98.2 F (36.8 C) (Oral)  Resp 18  Ht 5\' 6"  (1.676 m)  Wt 129 lb (58.514 kg)  BMI 20.83 kg/m2  LMP 07/29/2013      FHT: Category 1 UC:   Occasional, mild SVE:   Deferred--foley bulb still in place  Pitocin at 1 mu/min   Results for orders placed or performed during the hospital encounter of 04/11/14 (from the past 24 hour(s))  Urinalysis, Routine w reflex microscopic     Status: None   Collection Time: 04/11/14 12:50 PM  Result Value Ref Range   Color, Urine YELLOW YELLOW   APPearance CLEAR CLEAR   Specific Gravity, Urine 1.020 1.005 - 1.030   pH 5.5 5.0 - 8.0   Glucose, UA NEGATIVE NEGATIVE mg/dL   Hgb urine dipstick NEGATIVE NEGATIVE   Bilirubin Urine NEGATIVE NEGATIVE   Ketones, ur NEGATIVE NEGATIVE mg/dL   Protein, ur NEGATIVE NEGATIVE mg/dL   Urobilinogen, UA 0.2 0.0 - 1.0 mg/dL   Nitrite NEGATIVE NEGATIVE   Leukocytes, UA NEGATIVE NEGATIVE  Protein / creatinine ratio, urine     Status: None   Collection Time: 04/11/14 12:50 PM  Result Value Ref Range   Creatinine, Urine 60.00 mg/dL   Total Protein, Urine 8 mg/dL   Protein Creatinine Ratio 0.13 0.00 - 0.15  CBC     Status: Abnormal   Collection Time: 04/11/14  2:40 PM  Result Value Ref Range   WBC 11.1 (H) 4.0 - 10.5 K/uL   RBC 3.70 (L) 3.87 - 5.11 MIL/uL   Hemoglobin 11.4 (L) 12.0 - 15.0 g/dL   HCT 45.433.1 (L) 09.836.0 - 11.946.0 %   MCV 89.5 78.0 - 100.0 fL   MCH 30.8 26.0 - 34.0 pg   MCHC 34.4 30.0 - 36.0 g/dL   RDW 14.712.8 82.911.5 - 56.215.5 %   Platelets 312 150 - 400 K/uL  Comprehensive metabolic panel     Status: Abnormal   Collection Time: 04/11/14  2:40 PM  Result Value Ref Range   Sodium 134 (L) 135 - 145 mmol/L   Potassium 3.8 3.5 - 5.1 mmol/L   Chloride 107 96 - 112 mEq/L   CO2 19 19 - 32 mmol/L   Glucose, Bld 94 70 - 99 mg/dL   BUN 7 6 - 23 mg/dL   Creatinine, Ser 1.300.64 0.50 - 1.10 mg/dL   Calcium 8.3 (L) 8.4 - 10.5 mg/dL   Total Protein 6.1 6.0 - 8.3 g/dL   Albumin 2.7 (L) 3.5 - 5.2 g/dL   AST 31 0 - 37 U/L   ALT 35 0 - 35 U/L   Alkaline Phosphatase 285 (H) 39 - 117 U/L   Total Bilirubin 0.4 0.3 - 1.2 mg/dL   GFR calc non Af Amer >90 >90 mL/min   GFR calc Af Amer >90 >90 mL/min   Anion gap 8 5 - 15  Lactate dehydrogenase     Status: None   Collection Time: 04/11/14  2:40 PM  Result Value Ref Range   LDH 124 94 - 250 U/L  Uric acid     Status: None   Collection Time: 04/11/14  2:40 PM  Result Value Ref Range   Uric Acid, Serum 5.7 2.4 - 7.0 mg/dL     Assessment:  Induction for oligo, IUGR, abnormal dopplers GBS pending Chronic hypertension--no evidence pre-eclampsia  Plan: Continue current care  Sebastiano Luecke, Chip BoerVICKI  CNM 04/11/2014, 5:15 PM

## 2014-04-11 NOTE — MAU Note (Signed)
Urine in lab 

## 2014-04-11 NOTE — H&P (Signed)
[name] is a 39 y.o. female, [id number] at 58 2/7 weeks, presenting for induction due to IUGR, oligohydramnios, and abnormal dopplers.  Seen in office today, with BPP 8/8, but EFW was 4+9, <3%ile, AFI 4.45, 3%ile, and abnormal doppler flow (no absent or reverse end diastolic flow).  Patient sent to MAU for monitoring, with FHR Category 1.  MFM consult with Dr. Lowry Ram recommended admitting for induction today due to risk issues.  Patient agreeable with plan.  Patient denies leaking, bleeding, or contractions, and reports +FM.  She denies HA, visual sx, or epigastric pain.  Patient Active Problem List   Diagnosis Date Noted  . IUGR (intrauterine growth restriction) 04/11/2014  . Oligohydramnios in third trimester 04/11/2014  . Latex allergy 04/11/2014  . Allergy to penicillin 04/11/2014  . Alcohol use affecting pregnancy 04/11/2014  . Substance abuse--marijuana use early pregnancy 04/11/2014  . Hx of pre-eclampsia in prior pregnancy, currently pregnant--2nd pregnancy 04/11/2014  . Insufficient weight gain during pregnancy 04/11/2014  . Bipolar 1 disorder 10/02/2013  . HTN (hypertension)--no recent/current meds. 10/02/2013  . Smoker 10/02/2013    History of present pregnancy: Patient entered care at 15 1/7 weeks.   EDC of 05/07/14 was established by [place] at 9 weeks.   Anatomy scan:  18 2/7 weeks, with normal anatomy and a posterior placenta previa.   Additional [place] evaluations:   26 3/7 weeks:  EFW 851 gm, 1+14, 17.1%ile, AFV WNL, vtx, cervix 5.0, complete previa 30 3/7 weeks:  EFW 1434 gm, 3+3, 17.1%ile, AFI 13.31, 45%ile, vtx, resolution of previa, cervix closed 33 1/7 weeks:  EFW 1838 gm, 4+1, 11.2%ile, HC < 2.3%ile, cervix 3.16, AFI 12.03, 35%ile, BPP 8/8 Significant prenatal events:  Entered care at 15 1/7 weeks.  On Latuda and Lamictal for bipolar dx.  Reported heavy etoh use prior to pregnancy diagnosis, with MJ use.  On ATB for UTI at 18 weeks.  Previa noted on anatomy US,  resolved by 30 weeks.  Seen at MFM in September for consult due to etoh use in early pregnancy and meds for bipolar dx.  Fetal echo recommended and done, WNL.  [place] at 23 weeks for previa f/u showed persistence of the previa and low growth percentile.  Patient's mother passed away during 2023-03-29, creating much stress for the patient.  She signed tubal papers 03/02/15.  She lived with a friend during pregnancy.  Total weight gain in pregnancy was approx 11 lbs. GBS done yesterday. Last evaluation:  04/11/14--cervix 1 cm, 50%, vtx, -3.  [place] EFW 2070 gm, 4+9, <3%ile, AFI 4.45, 3%ile, vtx, posterior placenta, UA dopplers showed no absent or reversed flow, but S/D ratio < 5%ile.  BPD, femur, and abdomen all <3%ile, with HC 5%ile. Sent to MAU for monitoring and determination of plan of care.  NST   OB History    Gravida Para Term Preterm AB TAB SAB Ectopic Multiple Living   7 2 0  2003--SVB, 37 weeks, induced for IUGR, 4 hour labor, female, 4+11, epidural, [facility] 2005--ectopic 2006--SAB at 6 weeks 2007--SVB, 39 weeks, 29 hour labor, induced due to ? Cholestasis and elevated BP, 5+5, female, epidural, pre-eclampsia, delivered at [facility] 2008--SAB at 6 weeks 2009--SAB at 6 weeks.  Past Medical History  Diagnosis Date  . Hypertension   . Bipolar 1 disorder   . History of miscarriage    Past Surgical History  Procedure Laterality Date  . No past surgeries  Family History: family history includes Cancer in her maternal grandfather, maternal grandmother, mother, and son. Father HTN, COPD, etoh and drug abuse.  Mother anemia, neuropathy, etoh and drug use--passed away in December.  Social History:  reports that she has been smoking.  She has never used smokeless tobacco. She reports that she drinks about 14.4 oz of alcohol per week. She reports that she does not use illicit drugs. Patient is Caucasian, of the [place] faith, high school educated, currently unemployed.     Prenatal  Transfer Tool  Maternal Diabetes: No Genetic Screening: Normal Quad screen Maternal Ultrasounds/Referrals: Abnormal:  Findings:   Other:Oligohydramnios, IUGR, abnormal doppler flow Fetal Ultrasounds or other Referrals:  Fetal echo WNL Maternal Substance Abuse:  Yes:  Type: Smoker, Marijuana, Other: Etoh in early pregnancy Significant Maternal Medications:  Meds include: Other: Latuda, Lamictal Significant Maternal Lab Results: Lab values include: Other: GBS PCR done at 3:52p  TDAP 02/02/15 Flu N/A  ROS:  +FM  Allergies  Allergen Reactions  . Latex Rash  . Penicillins Rash    Dilation: 1 Effacement (%): 50 Station: -2 Exam by:: Manfred ArchV. Gloriann Riede, CNM Blood pressure 136/90, pulse 66, temperature 97.7 F (36.5 C), temperature source Axillary, resp. rate 16, height 5\' 6"  (1.676 m), weight 129 lb (58.514 kg), last menstrual period 07/29/2013.  Filed Vitals:   04/11/14 1901 04/11/14 1931 04/11/14 2000 04/11/14 2030  BP: 136/84 143/81 152/89 136/90  Pulse: 76 63 66 66  Temp:   97.7 F (36.5 C)   TempSrc:   Axillary   Resp: 18 18 16 16   Height:      Weight:         Chest clear Heart RRR without murmur Abd gravid, NT, FH 32 cm Pelvic: 1 cm, 50%, vtx, -2, mid-position Ext: DTR 1+, no clonus, tr edema.  FHR: Category 1 UCs:  None  Prenatal labs: ABO, Rh: --/--/O POS (01/13 1440) Antibody: NEG (01/13 1440)neg Rubella:   Immune RPR: Nonreactive (07/06 0000) NR HBsAg: Negative (07/06 0000) Neg HIV: NONREACTIVE (07/06 1456)  GBS: Negative (01/13 0000)Pending from 04/10/14 Sickle cell/Hgb electrophoresis:  AA Pap:  10/2013 WNl GC:  Negative 11/10/13 Chlamydia:  Negative 11/10/13 Genetic screenings:  Normal Quad screen Glucola:  WNL Other:   Hgb 13.8 at NOB, 11.6 at 28 weeks Thyroid panel with TSH WNL 11/14/13 Drug screen negative 11/14/13 15K Ecoli 11/14/13 Hgb A1C 5.2 12/06/13     Assessment/Plan: IUP at 36 2/7 weeks IUGR Oligohydramnios Abnormal dopplers Chronic  hypertension--no recent/current meds Bipolar dx--on Latuda and Lamictal Smoker Etoh/MJ use in early pregnancy Latex allergy PCN allergy Depression--[facility]  [facility]: [facility] consult with Dr. Normand Sloopillard and Dr. Claudean SeveranceWhitecar. Routine [facility] orders Anticipate use of foley bulb and pitocin. PIH labs and PCR Pain med/epidural prn GBS by PCR on admission to L&D. Continue Latuda--takes in am with meal. UDS  Robyne AskewWILLIAMS, KIMBERLYCNM, [place] 04/11/2014, 9:28 PM

## 2014-04-12 ENCOUNTER — Inpatient Hospital Stay (HOSPITAL_COMMUNITY): Payer: Medicaid Other | Admitting: Anesthesiology

## 2014-04-12 ENCOUNTER — Encounter (HOSPITAL_COMMUNITY): Payer: Self-pay | Admitting: *Deleted

## 2014-04-12 LAB — HIV ANTIBODY (ROUTINE TESTING W REFLEX)
HIV 1/O/2 Abs-Index Value: <1 (ref <1.00)
HIV-1/HIV-2 Ab: NONREACTIVE

## 2014-04-12 LAB — SURGICAL PCR SCREEN
MRSA, PCR: NEGATIVE
STAPHYLOCOCCUS AUREUS: NEGATIVE

## 2014-04-12 LAB — RPR: RPR Ser Ql: NONREACTIVE

## 2014-04-12 MED ORDER — FAMOTIDINE 20 MG PO TABS
40.0000 mg | ORAL_TABLET | Freq: Once | ORAL | Status: AC
  Administered 2014-04-13: 40 mg via ORAL
  Filled 2014-04-12: qty 2

## 2014-04-12 MED ORDER — PANTOPRAZOLE SODIUM 40 MG PO TBEC
40.0000 mg | DELAYED_RELEASE_TABLET | Freq: Every day | ORAL | Status: DC
  Administered 2014-04-12 – 2014-04-14 (×3): 40 mg via ORAL
  Filled 2014-04-12 (×3): qty 1

## 2014-04-12 MED ORDER — BENZOCAINE-MENTHOL 20-0.5 % EX AERO
1.0000 "application " | INHALATION_SPRAY | CUTANEOUS | Status: DC | PRN
  Administered 2014-04-13: 1 via TOPICAL
  Filled 2014-04-12: qty 56

## 2014-04-12 MED ORDER — LURASIDONE HCL 40 MG PO TABS
40.0000 mg | ORAL_TABLET | Freq: Every day | ORAL | Status: DC
  Administered 2014-04-12 – 2014-04-13 (×2): 40 mg via ORAL
  Filled 2014-04-12 (×3): qty 1

## 2014-04-12 MED ORDER — DIBUCAINE 1 % RE OINT
1.0000 "application " | TOPICAL_OINTMENT | RECTAL | Status: DC | PRN
  Administered 2014-04-13: 1 via RECTAL
  Filled 2014-04-12: qty 28

## 2014-04-12 MED ORDER — ONDANSETRON HCL 4 MG/2ML IJ SOLN: 4.0000 mg | INTRAMUSCULAR | Status: DC | PRN

## 2014-04-12 MED ORDER — WITCH HAZEL-GLYCERIN EX PADS
1.0000 "application " | MEDICATED_PAD | CUTANEOUS | Status: DC | PRN
  Administered 2014-04-12: 1 via TOPICAL

## 2014-04-12 MED ORDER — TETANUS-DIPHTH-ACELL PERTUSSIS 5-2.5-18.5 LF-MCG/0.5 IM SUSP: 0.5000 mL | Freq: Once | INTRAMUSCULAR | Status: DC

## 2014-04-12 MED ORDER — PRENATAL MULTIVITAMIN CH
1.0000 | ORAL_TABLET | Freq: Every day | ORAL | Status: DC
  Administered 2014-04-14: 1 via ORAL
  Filled 2014-04-12: qty 1

## 2014-04-12 MED ORDER — FENTANYL 2.5 MCG/ML BUPIVACAINE 1/10 % EPIDURAL INFUSION (WH - ANES)
INTRAMUSCULAR | Status: DC | PRN
  Administered 2014-04-12: 14 mL/h via EPIDURAL

## 2014-04-12 MED ORDER — ZOLPIDEM TARTRATE 5 MG PO TABS: 5.0000 mg | ORAL_TABLET | Freq: Every evening | ORAL | Status: DC | PRN

## 2014-04-12 MED ORDER — LANOLIN HYDROUS EX OINT: TOPICAL_OINTMENT | CUTANEOUS | Status: DC | PRN

## 2014-04-12 MED ORDER — DIPHENHYDRAMINE HCL 25 MG PO CAPS: 25.0000 mg | ORAL_CAPSULE | Freq: Four times a day (QID) | ORAL | Status: DC | PRN

## 2014-04-12 MED ORDER — OXYCODONE-ACETAMINOPHEN 5-325 MG PO TABS
2.0000 | ORAL_TABLET | ORAL | Status: DC | PRN
  Administered 2014-04-13 – 2014-04-14 (×3): 2 via ORAL
  Filled 2014-04-12 (×3): qty 2

## 2014-04-12 MED ORDER — IBUPROFEN 600 MG PO TABS
600.0000 mg | ORAL_TABLET | Freq: Four times a day (QID) | ORAL | Status: DC
  Administered 2014-04-12 – 2014-04-14 (×8): 600 mg via ORAL
  Filled 2014-04-12 (×9): qty 1

## 2014-04-12 MED ORDER — SENNOSIDES-DOCUSATE SODIUM 8.6-50 MG PO TABS
2.0000 | ORAL_TABLET | ORAL | Status: DC
  Administered 2014-04-12 – 2014-04-14 (×2): 2 via ORAL
  Filled 2014-04-12 (×2): qty 2

## 2014-04-12 MED ORDER — ONDANSETRON HCL 4 MG PO TABS: 4.0000 mg | ORAL_TABLET | ORAL | Status: DC | PRN

## 2014-04-12 MED ORDER — LIDOCAINE HCL (PF) 1 % IJ SOLN
INTRAMUSCULAR | Status: DC | PRN
  Administered 2014-04-12 (×2): 5 mL
  Administered 2014-04-12: 3 mL

## 2014-04-12 MED ORDER — PNEUMOCOCCAL VAC POLYVALENT 25 MCG/0.5ML IJ INJ
0.5000 mL | INJECTION | INTRAMUSCULAR | Status: DC
Start: 2014-04-13 — End: 2014-04-12

## 2014-04-12 MED ORDER — OXYCODONE-ACETAMINOPHEN 5-325 MG PO TABS
1.0000 | ORAL_TABLET | ORAL | Status: DC | PRN
  Administered 2014-04-12 – 2014-04-13 (×2): 1 via ORAL
  Filled 2014-04-12 (×2): qty 1

## 2014-04-12 MED ORDER — LACTATED RINGERS IV SOLN
INTRAVENOUS | Status: DC
  Administered 2014-04-13: 12:00:00 via INTRAVENOUS

## 2014-04-12 MED ORDER — METOCLOPRAMIDE HCL 10 MG PO TABS
10.0000 mg | ORAL_TABLET | Freq: Once | ORAL | Status: AC
  Administered 2014-04-13: 10 mg via ORAL
  Filled 2014-04-12: qty 1

## 2014-04-12 MED ORDER — SIMETHICONE 80 MG PO CHEW: 80.0000 mg | CHEWABLE_TABLET | ORAL | Status: DC | PRN

## 2014-04-12 MED ORDER — MISOPROSTOL 200 MCG PO TABS
ORAL_TABLET | ORAL | Status: AC
  Administered 2014-04-12: 1000 ug
  Filled 2014-04-12: qty 5

## 2014-04-12 MED ORDER — LACTATED RINGERS IV SOLN
INTRAVENOUS | Status: DC
  Administered 2014-04-12: 05:00:00 via INTRAUTERINE

## 2014-04-12 NOTE — Progress Notes (Signed)
S/O: Was asked to come and evaluate pt due to moderate variables. Cervix unchanged, bleeding ceased. Previous Cat 1 FHRT, now Cat 2 due to intermittent, moderate variables to 60s-90s. MVUs 150s.  A: IUP at 36.3 wks IOL due to IUGR, oligo and abnormal dopplers Inadequate MVUs No cervical change  P: Pitocin off x 30 min. 02 mask, position change and increase in IVFs in place. Begin amnioinfusion - discussed process and rationale to pt who concurs w/ plan.   Lindsey ScarletKimberly Jamil Gray, CNM 04/12/14, 04:33 AM

## 2014-04-12 NOTE — Progress Notes (Signed)
No further bleeding noted pad count 1

## 2014-04-12 NOTE — Progress Notes (Signed)
Called to bedside by CNM due to excessive bleeding.  Upon arrival to room, pt in dorsal lithotomy with ~ 800 ml of dark blood pooling on pad underneath pt.  Pt s/p epidural is very comfortable and calm.Abdomen gravid and soft. Fetal tracing reactive without decelerations.   Pt has a h/o low lying placenta that resolved at 28 weeks per CNM.  Pt placed in stirrups to do a sterile speculum exam.  Prior to exam, a bedside ultrasound was performed and showed anterior lateral placenta extending from fundus to the LLQ.  Tip of placenta is above the head. Vaginal exam showed a thick cervix, no active bleeding.  Small clot at os. Bleeding observed from cervix ~ 1 min.  No vaginal, cervical or perineal lacerations noted. IUPC placed by CNM due to inability to trace contractions and desire to increase Pitocin.  Instructed to insert on pt's right to avoid the placenta.  Etiology of bleeding unclear.  May have been due to cervical  bleeding with foley bulb expulsion.  Fetal status remained reassuring.  Continue to observe closely.  Discussed s/sxs of placental abruption.

## 2014-04-12 NOTE — Progress Notes (Signed)
UR chart review completed.  

## 2014-04-12 NOTE — Anesthesia Procedure Notes (Signed)
Epidural Patient location during procedure: OB  Staffing Anesthesiologist: Phillips GroutARIGNAN, Starlene Consuegra Performed by: anesthesiologist   Preanesthetic Checklist Completed: patient identified, site marked, surgical consent, pre-op evaluation, timeout performed, IV checked, risks and benefits discussed and monitors and equipment checked  Epidural Patient position: sitting Prep: ChloraPrep Patient monitoring: heart rate, continuous pulse ox and blood pressure Approach: midline Location: L3-L4 Injection technique: LOR saline  Needle:  Needle type: Tuohy  Needle gauge: 17 G Needle length: 9 cm and 9 Needle insertion depth: 4 cm Catheter type: closed end flexible Catheter size: 20 Guage Catheter at skin depth: 8 cm Test dose: negative  Assessment Events: blood not aspirated, injection not painful, no injection resistance, negative IV test and no paresthesia  Additional Notes   Patient tolerated the insertion well without complications.

## 2014-04-12 NOTE — Progress Notes (Signed)
Shanon BrowCarrie R Mow MRN: [id number]  Subjective: -Care Assumed.  Nurse call reporting patient having variables.  In room to assess.  Patient reports comfort with epidural.  Father at bedside.  Objective: BP 139/85 mmHg  Pulse 79  Temp(Src) 98.5 F (36.9 C) (Oral)  Resp 17  Ht 5\' 6"  (1.676 m)  Wt 129 lb (58.514 kg)  BMI 20.83 kg/m2  SpO2 100%  LMP 07/29/2013  Breastfeeding? Unknown I/O last 3 completed shifts: In: -  Out: 800 [Urine:800] Total I/O In: -  Out: 600 [Urine:200; Blood:400] FHT: 125 bpm, Mod Var, +Variable Decels, +Accels UC: Q2-364min, palpates moderate SVE:   Dilation: 10 Effacement (%): 100 Station: +2, +3 Exam by:: Derriona Branscom,cnm Membranes: AROM at 0230 Pitocin: 304mUn/min  Assessment:  IUP at 36.5wks Cat II FT  2nd Stage Labor IOL for IUGR, Oligo  Plan: -Place in high fowlers, resolution of Cat II noted -Await urge/sentation to push  -Continue other mgmt as ordered  Nanda Bittick LYNN,MSN, CNM 04/12/2014, 3:22 PM

## 2014-04-12 NOTE — Anesthesia Preprocedure Evaluation (Signed)
Anesthesia Evaluation  Patient identified by MRN, date of birth, ID band Patient awake    Reviewed: Allergy & Precautions, H&P , NPO status , Patient's Chart, lab work & pertinent test results  History of Anesthesia Complications Negative for: history of anesthetic complications  Airway Mallampati: II  TM Distance: >3 FB Neck ROM: full    Dental no notable dental hx. (+) Teeth Intact   Pulmonary neg pulmonary ROS, Current Smoker,  breath sounds clear to auscultation  Pulmonary exam normal       Cardiovascular hypertension, negative cardio ROS  Rhythm:regular Rate:Normal     Neuro/Psych Bipolar Disorder negative neurological ROS  negative psych ROS   GI/Hepatic negative GI ROS, Neg liver ROS,   Endo/Other  negative endocrine ROS  Renal/GU negative Renal ROS  negative genitourinary   Musculoskeletal   Abdominal Normal abdominal exam  (+)   Peds  Hematology negative hematology ROS (+)   Anesthesia Other Findings   Reproductive/Obstetrics (+) Pregnancy                             Anesthesia Physical Anesthesia Plan  ASA: II  Anesthesia Plan: Epidural   Post-op Pain Management:    Induction:   Airway Management Planned:   Additional Equipment:   Intra-op Plan:   Post-operative Plan:   Informed Consent: I have reviewed the patients History and Physical, chart, labs and discussed the procedure including the risks, benefits and alternatives for the proposed anesthesia with the patient or authorized representative who has indicated his/her understanding and acceptance.     Plan Discussed with:   Anesthesia Plan Comments:         Anesthesia Quick Evaluation

## 2014-04-12 NOTE — Progress Notes (Addendum)
  Subjective: States not really feeling ctxs, but would like to get epidural now before ctxs become intense. Pt's father at bedside.  Objective: BP 121/83 mmHg  Pulse 68  Temp(Src) 97.7 F (36.5 C) (Oral)  Resp 16  Ht 5\' 6"  (1.676 m)  Wt 129 lb (58.514 kg)  BMI 20.83 kg/m2  LMP 07/29/2013      Filed Vitals:   04/11/14 2230 04/11/14 2300 04/11/14 2330 04/12/14 0000  BP: 132/83 138/89 127/70 121/83  Pulse: 67 81 59 68  Temp:    97.7 F (36.5 C)  TempSrc:    Oral  Resp: 16   16  Height:      Weight:        FHT: BL 130 w/ moderate variability, +accels, earlys, no lates or variables UC:   irregular, every 3-4 minutes, palpate mild SVE:   Dilation: 4.5 Effacement (%): 50 Station: -2 Exam by:: K.Dennard Vezina, CNM Pitocin at 5 mius/min  Bloody show continues and bulb palpable during exam. Bulb deflated and removed due to increased vaginal bleeding. Tip of bulb clotted --- cervical dilation suspected.  Assessment:  IUP at 36.3 wks IOL due to 1) IUGR, 2) Oligo and 3) Abnormal dopplers GBS neg Progressive labor Cat 1 FHRT  Plan: May have epidural Will attempt AROM and place IUPC when comfortable w/ epidural provided that vtx is low enough in pelvis Monitor maternal/fetal status closely Consult prn Expect progress and SVD  Sherre ScarletWILLIAMS, Shenell Rogalski CNM 04/12/2014, 12:26 AM

## 2014-04-12 NOTE — Progress Notes (Signed)
  Subjective: Pt w/ an increased amount of bloody show. Denies pain or any other sxs.  Objective: BP 115/77 mmHg  Pulse 83  Temp(Src) 97.7 F (36.5 C) (Oral)  Resp 20  Ht 5\' 6"  (1.676 m)  Wt 129 lb (58.514 kg)  BMI 20.83 kg/m2  SpO2 100%  LMP 07/29/2013     Abdomen: gravid, soft, NT FHT: Category 1 UC:   irregular SVE:   Dilation: 4.5 Effacement (%): 50 Station: -2 Exam by:: K.Willem Klingensmith, CNM Pitocin at 5 mius/min AROM'd, copious amount of bloody show intermixed w/ amniotic fluid  Assessment:  IUP at 36.3 wks IOL due to IUGR, oligo and abnormal dopplers GBS neg Increased bloody show  Plan: Several dark red clots removed while attempting to AROM. Dr. Dion BodyVarnado called to bedside. Bedside u/s performed by Dr. Dion BodyVarnado (refer to her note for details of scan). SSE revealed moderate sized clots at the cervical os and in the vaginal vault; gently removed.  Of note, pt had a complete previa that resolved around 30 wks. Will monitor closely. Continue to consult prn.  [name], [name] CNM 04/12/2014, 3:05 AM

## 2014-04-13 ENCOUNTER — Inpatient Hospital Stay (HOSPITAL_COMMUNITY): Payer: Medicaid Other | Admitting: Anesthesiology

## 2014-04-13 ENCOUNTER — Encounter (HOSPITAL_COMMUNITY): Payer: Self-pay | Admitting: Anesthesiology

## 2014-04-13 ENCOUNTER — Encounter (HOSPITAL_COMMUNITY)
Admission: AD | Disposition: A | Discharge status: Home / Self Care | Payer: Self-pay | Source: Ambulatory Visit | Attending: Obstetrics and Gynecology

## 2014-04-13 HISTORY — PX: TUBAL LIGATION: SHX77

## 2014-04-13 LAB — CBC
HEMATOCRIT: 22.1 % — AB (ref 36.0–46.0)
Hemoglobin: 7.6 g/dL — ABNORMAL LOW (ref 12.0–15.0)
MCH: 31.4 pg (ref 26.0–34.0)
MCHC: 34.4 g/dL (ref 30.0–36.0)
MCV: 91.3 fL (ref 78.0–100.0)
Platelets: 221 10*3/uL (ref 150–400)
RBC: 2.42 MIL/uL — AB (ref 3.87–5.11)
RDW: 13.1 % (ref 11.5–15.5)
WBC: 10.9 10*3/uL — ABNORMAL HIGH (ref 4.0–10.5)

## 2014-04-13 SURGERY — LIGATION, FALLOPIAN TUBE, POSTPARTUM
Anesthesia: Epidural | Site: Abdomen

## 2014-04-13 MED ORDER — FENTANYL CITRATE 0.05 MG/ML IJ SOLN
INTRAMUSCULAR | Status: AC
  Filled 2014-04-13: qty 2

## 2014-04-13 MED ORDER — SODIUM BICARBONATE 8.4 % IV SOLN
INTRAVENOUS | Status: DC | PRN
  Administered 2014-04-13: 3 mL via EPIDURAL
  Administered 2014-04-13 (×3): 5 mL via EPIDURAL

## 2014-04-13 MED ORDER — SODIUM BICARBONATE 8.4 % IV SOLN
INTRAVENOUS | Status: AC
  Filled 2014-04-13: qty 50

## 2014-04-13 MED ORDER — FERROUS SULFATE 325 (65 FE) MG PO TABS
325.0000 mg | ORAL_TABLET | Freq: Two times a day (BID) | ORAL | Status: DC
  Administered 2014-04-13 – 2014-04-14 (×2): 325 mg via ORAL
  Filled 2014-04-13 (×2): qty 1

## 2014-04-13 MED ORDER — FENTANYL CITRATE 0.05 MG/ML IJ SOLN
INTRAMUSCULAR | Status: DC | PRN
  Administered 2014-04-13 (×2): 50 ug via INTRAVENOUS

## 2014-04-13 MED ORDER — METOCLOPRAMIDE HCL 5 MG/ML IJ SOLN: 10.0000 mg | Freq: Once | INTRAMUSCULAR | Status: AC | PRN

## 2014-04-13 MED ORDER — PROPOFOL 10 MG/ML IV BOLUS
INTRAVENOUS | Status: AC
  Filled 2014-04-13: qty 20

## 2014-04-13 MED ORDER — PROPOFOL INFUSION 10 MG/ML OPTIME
INTRAVENOUS | Status: DC | PRN
  Administered 2014-04-13: 50 ug/kg/min via INTRAVENOUS

## 2014-04-13 MED ORDER — LIDOCAINE-EPINEPHRINE (PF) 2 %-1:200000 IJ SOLN
INTRAMUSCULAR | Status: AC
  Filled 2014-04-13: qty 20

## 2014-04-13 MED ORDER — ONDANSETRON HCL 4 MG/2ML IJ SOLN
INTRAMUSCULAR | Status: AC
  Filled 2014-04-13: qty 2

## 2014-04-13 MED ORDER — MIDAZOLAM HCL 2 MG/2ML IJ SOLN
INTRAMUSCULAR | Status: AC
  Filled 2014-04-13: qty 2

## 2014-04-13 MED ORDER — PHENYLEPHRINE 40 MCG/ML (10ML) SYRINGE FOR IV PUSH (FOR BLOOD PRESSURE SUPPORT)
PREFILLED_SYRINGE | INTRAVENOUS | Status: AC
  Filled 2014-04-13: qty 10

## 2014-04-13 MED ORDER — NICOTINE 21 MG/24HR TD PT24
21.0000 mg | MEDICATED_PATCH | Freq: Every day | TRANSDERMAL | Status: DC
Start: 2014-04-13 — End: 2014-04-14
  Administered 2014-04-13: 21 mg via TRANSDERMAL
  Filled 2014-04-13 (×3): qty 1

## 2014-04-13 MED ORDER — DEXAMETHASONE SODIUM PHOSPHATE 4 MG/ML IJ SOLN
INTRAMUSCULAR | Status: DC | PRN
  Administered 2014-04-13: 4 mg via INTRAVENOUS

## 2014-04-13 MED ORDER — BUPIVACAINE-EPINEPHRINE (PF) 0.5% -1:200000 IJ SOLN
INTRAMUSCULAR | Status: AC
  Filled 2014-04-13: qty 30

## 2014-04-13 MED ORDER — DEXAMETHASONE SODIUM PHOSPHATE 10 MG/ML IJ SOLN
INTRAMUSCULAR | Status: AC
  Filled 2014-04-13: qty 1

## 2014-04-13 MED ORDER — FENTANYL CITRATE 0.05 MG/ML IJ SOLN: 25.0000 ug | INTRAMUSCULAR | Status: DC | PRN

## 2014-04-13 MED ORDER — PNEUMOCOCCAL VAC POLYVALENT 25 MCG/0.5ML IJ INJ
0.5000 mL | INJECTION | INTRAMUSCULAR | Status: DC
  Filled 2014-04-13: qty 0.5

## 2014-04-13 MED ORDER — ONDANSETRON HCL 4 MG/2ML IJ SOLN
INTRAMUSCULAR | Status: DC | PRN
  Administered 2014-04-13: 4 mg via INTRAVENOUS

## 2014-04-13 MED ORDER — MEPERIDINE HCL 25 MG/ML IJ SOLN: 6.2500 mg | INTRAMUSCULAR | Status: DC | PRN

## 2014-04-13 MED ORDER — BUPIVACAINE-EPINEPHRINE 0.5% -1:200000 IJ SOLN
INTRAMUSCULAR | Status: DC | PRN
  Administered 2014-04-13: 10 mL

## 2014-04-13 MED ORDER — LACTATED RINGERS IV SOLN
INTRAVENOUS | Status: DC | PRN
  Administered 2014-04-13 (×2): via INTRAVENOUS

## 2014-04-13 MED ORDER — MIDAZOLAM HCL 5 MG/5ML IJ SOLN
INTRAMUSCULAR | Status: DC | PRN
Start: 2014-04-13 — End: 2014-04-13
  Administered 2014-04-13 (×3): 1 mg via INTRAVENOUS

## 2014-04-13 SURGICAL SUPPLY — 27 items
CHLORAPREP W/TINT 26ML (MISCELLANEOUS) ×2 IMPLANT
CLIP FILSHIE TUBAL LIGA STRL (Clip) ×2 IMPLANT
CLOTH BEACON ORANGE TIMEOUT ST (SAFETY) ×2 IMPLANT
CONTAINER PREFILL 10% NBF 15ML (MISCELLANEOUS) ×4 IMPLANT
DRSG OPSITE POSTOP 3X4 (GAUZE/BANDAGES/DRESSINGS) ×2 IMPLANT
ELECT REM PT RETURN 9FT ADLT (ELECTROSURGICAL) ×2
ELECTRODE REM PT RTRN 9FT ADLT (ELECTROSURGICAL) ×1 IMPLANT
GLOVE BIO SURGEON STRL SZ 6.5 (GLOVE) ×2 IMPLANT
GLOVE BIOGEL PI IND STRL 7.0 (GLOVE) ×1 IMPLANT
GLOVE BIOGEL PI INDICATOR 7.0 (GLOVE) ×1
GOWN STRL REUS W/TWL LRG LVL3 (GOWN DISPOSABLE) ×4 IMPLANT
NEEDLE HYPO 25X1 1.5 SAFETY (NEEDLE) ×2 IMPLANT
NS IRRIG 1000ML POUR BTL (IV SOLUTION) ×2 IMPLANT
PACK ABDOMINAL MINOR (CUSTOM PROCEDURE TRAY) ×2 IMPLANT
PENCIL BUTTON HOLSTER BLD 10FT (ELECTRODE) IMPLANT
SPONGE LAP 4X18 X RAY DECT (DISPOSABLE) IMPLANT
STRIP CLOSURE SKIN 1/2X4 (GAUZE/BANDAGES/DRESSINGS) IMPLANT
SUT MNCRL AB 3-0 PS2 27 (SUTURE) ×2 IMPLANT
SUT PLAIN 2 0 (SUTURE)
SUT PLAIN ABS 2-0 CT1 27XMFL (SUTURE) IMPLANT
SUT VIC AB 0 CT1 27 (SUTURE) ×1
SUT VIC AB 0 CT1 27XBRD ANBCTR (SUTURE) ×1 IMPLANT
SYR BULB IRRIGATION 50ML (SYRINGE) IMPLANT
SYR CONTROL 10ML LL (SYRINGE) ×2 IMPLANT
TOWEL OR 17X24 6PK STRL BLUE (TOWEL DISPOSABLE) ×4 IMPLANT
TRAY FOLEY CATH 14FR (SET/KITS/TRAYS/PACK) ×2 IMPLANT
WATER STERILE IRR 1000ML POUR (IV SOLUTION) ×2 IMPLANT

## 2014-04-13 NOTE — Progress Notes (Signed)
Patient requests nicotine patch; smokes 1/2 ppd.  Also wants help getting patch as outpatient.

## 2014-04-13 NOTE — Progress Notes (Signed)
Pt has arrived in short stay. IV fluids are infusing. IV working well. MD at bedside. Pt reports everything is off. Last time she had something to eat was 04/12/14 2130. Sips of water with medicine at noon today.

## 2014-04-13 NOTE — Discharge Summary (Signed)
Vaginal Delivery Discharge Summary  [name]  DOB:    [birth year] MRN:    [id number] CSN:    [id number]  Date of admission:                  04/11/14  Date of discharge:                   04/14/14  Procedures this admission:   Induction of labor due to oligo, IUGR, abnormal dopplers, SVB  Date of Delivery: 04/12/14  Newborn Data:  Live born female  Birth Weight: 5 lb 0.3 oz (2275 g) APGAR: 8, 9  Home with mother. Name: [name] Circumcision Plan: Outpatient  History of Present Illness:  [name] is a 39 y.o. female, [phone], who presents at [redacted]w[redacted]d weeks gestation. The patient has been followed at the [facility] and Gynecology division of [facility]. She was admitted induction of labor due to oligo, IUGR, abnormal dopplers. Her pregnancy has been complicated by:  Patient Active Problem List   Diagnosis Date Noted  . SVD (spontaneous vaginal delivery) 04/12/2014  . Laceration of periurethral tissue with delivery 04/12/2014  . IUGR (intrauterine growth restriction) 04/11/2014  . Oligohydramnios in third trimester 04/11/2014  . Latex allergy 04/11/2014  . Allergy to penicillin 04/11/2014  . Alcohol use affecting pregnancy 04/11/2014  . Substance abuse--marijuana use early pregnancy 04/11/2014  . Hx of pre-eclampsia in prior pregnancy, currently pregnant--2nd pregnancy 04/11/2014  . Insufficient weight gain during pregnancy 04/11/2014  . Bipolar 1 disorder 10/02/2013  . HTN (hypertension)--no recent/current meds. 10/02/2013  . Smoker 10/02/2013     Hospital Course:  Admitted 04/11/14 after [place] in office showed oligo, IUGR, and abnormal dopplers.  Negative GBS noted on PCR.  Progressed with foley bulb and pitocin. Utilized epidural for pain management.  Delivery was performed by [name], CNM, without complication. Patient and baby tolerated the procedure without difficulty, with periurethral laceration noted. Infant status  was stable and remained in room with mother.  Mother and infant then had an uncomplicated postpartum course, with pumping for breast feeding going well. Hgb on day 1 was 7.6, down from 11.4 on admission, and 7.5 on day 2.  Patient declined transfusion.  Orthostatics were WNL.  PP BTL was performed on day 1 by Dr. Normand Sloop and tolerated well.  Mom's physical exam was WNL, and she was discharged home in stable condition. She requested assistance in establishing a plan for continued support of no etho use--SW saw the patient, and patient will f/u with the program, [facility] of which she was a participant in the past.  Nicotine patch was also started pp, and will be continued after d/c.  She received adequate benefit from po pain medications and was sent home with Ibuprophen, Percocet, Nicotine patch, and Fe.   Feeding:  breast  Contraception:  bilateral tubal ligation  Discharge hemoglobin:  HEMOGLOBIN  Date Value Ref Range Status  04/14/2014 7.5* 12.0 - 15.0 g/dL Final   HCT  Date Value Ref Range Status  04/14/2014 22.1* 36.0 - 46.0 % Final     Discharge Physical Exam:  Orthostatics stable General: alert Lochia: appropriate Uterine Fundus: firm Incision: healing well--perineum and BTL incision DVT Evaluation: No evidence of DVT seen on physical exam. Negative Homan's sign.  Intrapartum Procedures: spontaneous vaginal delivery Postpartum Procedures: P.P. tubal ligation Complications-Operative and Postpartum: Periurethral laceration, anemia without hemodynamic instability  Discharge Diagnoses: Term Pregnancy-delivered and oligohydramnios, IUGR, abnormal dopplers, anemia,  PPBTL.  Discharge Information:  Activity:           pelvic rest Diet:                routine Medications: Ibuprofen, Iron, Percocet and ferrous sulfate, Nicotine patches Condition:      stable Instructions:     Discharge to: home  Follow-up Information    [facility]. Schedule an appointment as soon as possible for a visit in 6 weeks.   Specialty:  Obstetrics and Gynecology   Why:  Call for any questions or concerns.   Contact information:   [address] [place] [phone]       Lindsey BridgemanLATHAM, Ronneisha Jett CNM 04/14/2014 8:14 AM

## 2014-04-13 NOTE — Transfer of Care (Signed)
Immediate Anesthesia Transfer of Care Note  Patient: [name]  Procedure(s) Performed: Procedure(s): POST PARTUM TUBAL LIGATION (N/A)  Patient Location: PACU  Anesthesia Type:Epidural  Level of Consciousness: awake, alert  and oriented  Airway & Oxygen Therapy: Patient Spontanous Breathing  Post-op Assessment: Report given to PACU RN and Post -op Vital signs reviewed and stable  Post vital signs: Reviewed and stable  Complications: No apparent anesthesia complications

## 2014-04-13 NOTE — Anesthesia Postprocedure Evaluation (Signed)
Anesthesia Post Note  Patient: [name]  Procedure(s) Performed: Procedure(s) (LRB): POST PARTUM TUBAL LIGATION (N/A)  Anesthesia type: Epidural  Patient location: PACU  Post pain: Pain level controlled  Post assessment: Post-op Vital signs reviewed  Last Vitals:  Filed Vitals:   04/13/14 1500  BP: 107/56  Pulse: 66  Temp:   Resp: 26    Post vital signs: Reviewed  Level of consciousness: awake  Complications: No apparent anesthesia complications

## 2014-04-13 NOTE — Op Note (Signed)
reop diagnosis: Multiparity desires sterilization  Postop diagnosis: Same  Procedure: Postpartum tubal ligation Surgeon: Honour Schwieger Anesthesia:epidural Estimated blood loss: Minimal Complications: None  Pathology: Bilateral portion of tubes Condition and transferred to PACU: stable   Before the tubal ligation was done, the patient was told that the risks are but not limited to bleeding infection damage to internal organs such as bowel bladder major blood vessels. The patient understands that the failure rate is 1 in 200. She understands a half of those failures can result in ectopic or tubal pregnancy. The patient was also well aware of all birth control that was available to her. The patient was taken to the operating room. Once her epidural was found to be adequate, a timeout was done. The patient's consent was signed.  20 cc of local was she used to infiltrate around the umbilicus to obtain adequate anesthesia. This amounts was used because the patient felt prickly touch with the Allis clamp. Once all the anesthesia was found to be adequate, I began the procedure. A 10 mm infraumbilical incision was made along her previous incision. The fascia was then grasped with 2 cokers after the subcutaneous tissue had been bluntly and sharply dissected. The fascia was then incised and extended bilaterally. The peritoneum was entered bluntly. A tagged e tape was placed in the abdomen to pack away the bowel in order to see the uterus and tubes.   The patient's right fallopian tube was grasped with Babcock clamp. Follow out to the fimbriated end.  A filshe clip was placed in the midisthmic portion of the tube.  . The patient's left fallopian tube was grasped with Babcock clamp followed out to the fimbriated end. Another filshie clip was placed in the correct position. Both tubes were returned to the abdomen. Hemostasis was noted. The peritoneum was closed with 0 Vicryl. The fascia was also closed using 0 Vicryl.  The skin was closed with 3-0 Monocryl in subcuticular fashion. Sponge lap and needle counts were correct. Patient went to recovery in stable condition.

## 2014-04-13 NOTE — Anesthesia Postprocedure Evaluation (Signed)
  Anesthesia Post Note  Patient: [name]  Procedure(s) Performed: * No procedures listed *  Anesthesia type: Epidural  Patient location: Mother/Baby  Post pain: Pain level controlled  Post assessment: Post-op Vital signs reviewed  Last Vitals:  Filed Vitals:   04/13/14 0845  BP: 125/78  Pulse: 96  Temp:   Resp:     Post vital signs: Reviewed  Level of consciousness:alert  Complications: No apparent anesthesia complications

## 2014-04-13 NOTE — Progress Notes (Signed)
CSW attempted to meet with the MOB to complete assessment due to history of bipolar, THC, and etoh use during pregnancy.  MOB was not in her room due to preparing for her tubal.    CSW will follow up with the MOB after procedure is completed.  

## 2014-04-13 NOTE — Anesthesia Preprocedure Evaluation (Addendum)
Anesthesia Evaluation  Patient identified by MRN, date of birth, ID band Patient awake    Reviewed: Allergy & Precautions, H&P , NPO status , Patient's Chart, lab work & pertinent test results  History of Anesthesia Complications Negative for: history of anesthetic complications  Airway Mallampati: II  TM Distance: >3 FB Neck ROM: full    Dental no notable dental hx. (+) Teeth Intact   Pulmonary neg pulmonary ROS, Current Smoker,  breath sounds clear to auscultation  Pulmonary exam normal       Cardiovascular hypertension, negative cardio ROS  Rhythm:regular Rate:Normal     Neuro/Psych PSYCHIATRIC DISORDERS Anxiety Depression Bipolar Disorder negative neurological ROS  negative psych ROS   GI/Hepatic negative GI ROS, Neg liver ROS, (+)     substance abuse  alcohol use and marijuana use,   Endo/Other  negative endocrine ROS  Renal/GU negative Renal ROS  negative genitourinary   Musculoskeletal negative musculoskeletal ROS (+)   Abdominal Normal abdominal exam  (+)   Peds  Hematology  (+) anemia ,   Anesthesia Other Findings   Reproductive/Obstetrics Desires Sterilization                            Anesthesia Physical  Anesthesia Plan  ASA: II  Anesthesia Plan: Epidural   Post-op Pain Management:    Induction:   Airway Management Planned: Natural Airway  Additional Equipment:   Intra-op Plan:   Post-operative Plan:   Informed Consent: I have reviewed the patients History and Physical, chart, labs and discussed the procedure including the risks, benefits and alternatives for the proposed anesthesia with the patient or authorized representative who has indicated his/her understanding and acceptance.     Plan Discussed with: Anesthesiologist, CRNA and Surgeon  Anesthesia Plan Comments: (Now for PPTL. Epidural cath in - situ.)       Anesthesia Quick Evaluation                                   Anesthesia Evaluation  Patient identified by MRN, date of birth, ID band Patient awake    Reviewed: Allergy & Precautions, H&P , NPO status , Patient's Chart, lab work & pertinent test results  History of Anesthesia Complications Negative for: history of anesthetic complications  Airway Mallampati: II  TM Distance: >3 FB Neck ROM: full    Dental no notable dental hx. (+) Teeth Intact   Pulmonary neg pulmonary ROS, Current Smoker,  breath sounds clear to auscultation  Pulmonary exam normal       Cardiovascular hypertension, negative cardio ROS  Rhythm:regular Rate:Normal     Neuro/Psych Bipolar Disorder negative neurological ROS  negative psych ROS   GI/Hepatic negative GI ROS, Neg liver ROS,   Endo/Other  negative endocrine ROS  Renal/GU negative Renal ROS  negative genitourinary   Musculoskeletal   Abdominal Normal abdominal exam  (+)   Peds  Hematology negative hematology ROS (+)   Anesthesia Other Findings   Reproductive/Obstetrics (+) Pregnancy                             Anesthesia Physical Anesthesia Plan  ASA: II  Anesthesia Plan: Epidural   Post-op Pain Management:    Induction:   Airway Management Planned:   Additional Equipment:   Intra-op Plan:   Post-operative Plan:   Informed Consent: I  have reviewed the patients History and Physical, chart, labs and discussed the procedure including the risks, benefits and alternatives for the proposed anesthesia with the patient or authorized representative who has indicated his/her understanding and acceptance.     Plan Discussed with:   Anesthesia Plan Comments:         Anesthesia Quick Evaluation

## 2014-04-13 NOTE — Progress Notes (Addendum)
Subjective: Postpartum Day 1: Vaginal delivery, periurethral laceration Patient up ad lib, reports no syncope or dizziness. Feeding:  Pumping for breastmilk Contraceptive plan:  BTL scheduled today at 1330  NPO for BTL today, epidural cath still in place--patient still definite about plan.  Requests referral to etoh treatment program--pre[facility], was successful.  Pediatrician recommended establishing that to increase patient's case for maintaining custody of this baby.  Patient also using nicotine patch for smoking cessation.  Plans outpatient circumcision.  Objective: Vital signs in last 24 hours: Temp:  [98.2 F (36.8 C)-98.5 F (36.9 C)] 98.2 F (36.8 C) (01/15 0621) Pulse Rate:  [59-82] 71 (01/15 0621) Resp:  [17-20] 18 (01/15 0621) BP: (107-143)/(57-97) 118/78 mmHg (01/15 16100621)  Physical Exam:  General: alert Lochia: appropriate Uterine Fundus: firm Perineum: Perineum healing well. DVT Evaluation: No evidence of DVT seen on physical exam. Negative Homan's sign.    Recent Labs  04/11/14 1440 04/13/14 0535  HGB 11.4* 7.6*  HCT 33.1* 22.1*    Assessment/Plan: Status post vaginal delivery day 1. Anemia with hemodynamic stability Smoker--on patch Hx etoh abuse For BTL today   Plan: SW consult for determination of options for etoh treatment. Continue nicotine patch after d/c Orthostatic VS Fe BID CBC tomorrow.   Nyra CapesLATHAM, VICKICNM 04/13/2014, 8:43 AM

## 2014-04-14 LAB — CBC
HCT: 22.1 % — ABNORMAL LOW (ref 36.0–46.0)
Hemoglobin: 7.5 g/dL — ABNORMAL LOW (ref 12.0–15.0)
MCH: 31.1 pg (ref 26.0–34.0)
MCHC: 33.9 g/dL (ref 30.0–36.0)
MCV: 91.7 fL (ref 78.0–100.0)
PLATELETS: 244 10*3/uL (ref 150–400)
RBC: 2.41 MIL/uL — ABNORMAL LOW (ref 3.87–5.11)
RDW: 13.1 % (ref 11.5–15.5)
WBC: 10.2 10*3/uL (ref 4.0–10.5)

## 2014-04-14 MED ORDER — NICOTINE 21 MG/24HR TD PT24: 21.0000 mg | MEDICATED_PATCH | Freq: Every day | TRANSDERMAL | Status: DC

## 2014-04-14 MED ORDER — OXYCODONE-ACETAMINOPHEN 5-325 MG PO TABS: 1.0000 | ORAL_TABLET | ORAL | Status: DC | PRN

## 2014-04-14 MED ORDER — IBUPROFEN 600 MG PO TABS: 600.0000 mg | ORAL_TABLET | Freq: Four times a day (QID) | ORAL | Status: DC

## 2014-04-14 MED ORDER — FERROUS SULFATE 325 (65 FE) MG PO TABS: 325.0000 mg | ORAL_TABLET | Freq: Two times a day (BID) | ORAL | Status: DC

## 2014-04-14 NOTE — Progress Notes (Signed)
Spoke with [name] and informed that newborn may return home with mother and CPI will follow up with the family in the community.  Revisited mother and informed her of above.   

## 2014-04-14 NOTE — Discharge Instructions (Signed)

## 2014-04-14 NOTE — Anesthesia Postprocedure Evaluation (Signed)
  Anesthesia Post-op Note  Patient: Lindsey BrowCarrie R Kulak  Procedure(s) Performed: Procedure(s): POST PARTUM TUBAL LIGATION (N/A)  Patient Location: PACU and Mother/Baby  Anesthesia Type:Epidural  Level of Consciousness: awake, alert  and oriented  Airway and Oxygen Therapy: Patient Spontanous Breathing  Post-op Pain: mild  Post-op Assessment: Post-op Vital signs reviewed, Patient's Cardiovascular Status Stable, Patent Airway, No signs of Nausea or vomiting, Pain level controlled, No headache, No backache, No residual numbness and No residual motor weakness  Post-op Vital Signs: Reviewed and stable  Last Vitals:  Filed Vitals:   04/14/14 0530  BP: 129/65  Pulse: 60  Temp: 36.5 C  Resp: 18    Complications: No apparent anesthesia complications

## 2014-04-14 NOTE — Addendum Note (Signed)
Addendum  created 04/14/14 0911 by Elbert Ewingsolleen S Siddalee Vanderheiden, CRNA   Modules edited: Notes Section   Notes Section:  File: [id number]

## 2014-04-14 NOTE — Progress Notes (Signed)
Clinical Social Work Department PSYCHOSOCIAL ASSESSMENT - MATERNAL/CHILD 04/14/2014  Patient: Danielson,Izora R Account Number: [id number] [place] Date: 04/11/2014  Ardine Eng Name:  Gertie Fey    Clinical Social Worker: Kaleth Koy, LCSW Date/Time: 04/14/2014 11:00 AM  Date Referred: 04/12/2014  Referral source  Central [facility]    Referred reason  Behavioral Health Issues  Substance Abuse   Other referral source:   I: FAMILY / HOME ENVIRONMENT Child's legal guardian: PARENT  Guardian - Name Guardian - Age Guardian - Address  [address], 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  Museum/gallery curator resources: [employer] [place]:  Other  [place] / Grade:  Maternity Care Coordinator / Child Services Coordination / Early Interventions: Cultural issues impacting care:   III STRENGTHS Strengths  Home prepared for Child (including basic supplies)  Supportive family/friends  Adequate Resources   Strength comment:   IV RISK FACTORS AND CURRENT PROBLEMS Current Problem:  Risk Factor & Current Problem Patient Issue Family Issue Risk Factor / Current Problem Comment  Mental Illness Y N Mother has hx of bipolar and PTSD  Substance Abuse Y N Mother has hx of crack addiction, marijuana use, and is currently drinking 3-5 beers 3 times a week.  DSS Involvement Y N 39 year old was adopted and 39year old was placed with paternal relative.    V SOCIAL WORK ASSESSMENT Acknowledged order for social work consult to address concerns regarding mother's hx  of substance abuse and mental illness. Met with mother who was pleasant and receptive to CSW intervention. She had a friend visiting and requested the friend remain in the room during the assessment. She is a single parent with two other children ages 81 and 66. Both children are not in her care. The 80 year old was adopted and the 39 year old is in the custody of "her mother -in-law". Mother states that he started working on reunification, but she became discouraged. She was reportedly using crack during the time and was not allow interaction with the 39 year old because it was causing him emotional distress. She communicate a desire to parent this child. Informed that she has been clean for the past two years. She reported hx of marijuana, but "crack was my drug of choice". However, she continues to use alcohol. She has reportedly been drinking 3-5 beers 3 times a week during the pregnancy. She reports hx of substance abuse treatment at [facility] for 12 months. UDS on newborn and mother were negative. Mother reports hx of bipolar and PTSD. She is reportedly being prescribed latuda and reports compliance with meds. Mother communicate plans to seek treatment with [facility] again for alcohol treatment.   Mother states that she has been in a stable home with her friend for the past year and feel well prepared for newborn. Informed her of referral that will be made to DSS due to her hx with substance abuse and losing custody of her other children. She understood. Will continue to follow.    VI SOCIAL WORK PLAN Social Work Plan  Child Protective Services Report   Type of pt/family education:  Hospital's drug  screen policy  DSS report   If child protective services report - county: [place] If child protective services report - date: 04/14/2014 Information/referral to community resources comment:  Spoke with Maryruth Eve with DSS, and inform  that she will staff the case with her supervisor and recall this Probation officer.   Other social work plan:  Awaiting response from [place] regarding discharge

## 2014-04-16 ENCOUNTER — Encounter (HOSPITAL_COMMUNITY): Payer: Self-pay | Admitting: Obstetrics and Gynecology

## 2014-04-19 ENCOUNTER — Inpatient Hospital Stay (HOSPITAL_COMMUNITY)
Admission: AD | Admit: 2014-04-19 | Discharge: 2014-04-19 | Disposition: A | Discharge status: Home / Self Care | Payer: Medicaid Other | Source: Ambulatory Visit | Attending: Obstetrics & Gynecology | Admitting: Obstetrics & Gynecology

## 2014-04-19 ENCOUNTER — Encounter (HOSPITAL_COMMUNITY): Payer: Self-pay | Admitting: *Deleted

## 2014-04-19 DIAGNOSIS — Z91040 Latex allergy status: Secondary | ICD-10-CM | POA: Diagnosis not present

## 2014-04-19 DIAGNOSIS — Z880 Allergy status to penicillin: Secondary | ICD-10-CM | POA: Diagnosis not present

## 2014-04-19 DIAGNOSIS — F17210 Nicotine dependence, cigarettes, uncomplicated: Secondary | ICD-10-CM | POA: Diagnosis not present

## 2014-04-19 DIAGNOSIS — O99335 Smoking (tobacco) complicating the puerperium: Secondary | ICD-10-CM | POA: Insufficient documentation

## 2014-04-19 DIAGNOSIS — I10 Essential (primary) hypertension: Secondary | ICD-10-CM | POA: Diagnosis present

## 2014-04-19 DIAGNOSIS — R030 Elevated blood-pressure reading, without diagnosis of hypertension: Secondary | ICD-10-CM | POA: Diagnosis present

## 2014-04-19 DIAGNOSIS — O9089 Other complications of the puerperium, not elsewhere classified: Secondary | ICD-10-CM | POA: Insufficient documentation

## 2014-04-19 DIAGNOSIS — IMO0001 Reserved for inherently not codable concepts without codable children: Secondary | ICD-10-CM

## 2014-04-19 LAB — CBC
HEMATOCRIT: 26.6 % — AB (ref 36.0–46.0)
HEMOGLOBIN: 8.8 g/dL — AB (ref 12.0–15.0)
MCH: 31 pg (ref 26.0–34.0)
MCHC: 33.1 g/dL (ref 30.0–36.0)
MCV: 93.7 fL (ref 78.0–100.0)
Platelets: 506 10*3/uL — ABNORMAL HIGH (ref 150–400)
RBC: 2.84 MIL/uL — ABNORMAL LOW (ref 3.87–5.11)
RDW: 13.2 % (ref 11.5–15.5)
WBC: 7.5 10*3/uL (ref 4.0–10.5)

## 2014-04-19 LAB — COMPREHENSIVE METABOLIC PANEL
ALT: 19 U/L (ref 0–35)
ANION GAP: 6 (ref 5–15)
AST: 15 U/L (ref 0–37)
Albumin: 3 g/dL — ABNORMAL LOW (ref 3.5–5.2)
Alkaline Phosphatase: 115 U/L (ref 39–117)
BUN: 11 mg/dL (ref 6–23)
CALCIUM: 8.5 mg/dL (ref 8.4–10.5)
CHLORIDE: 105 meq/L (ref 96–112)
CO2: 24 mmol/L (ref 19–32)
Creatinine, Ser: 0.8 mg/dL (ref 0.50–1.10)
Glucose, Bld: 103 mg/dL — ABNORMAL HIGH (ref 70–99)
Potassium: 3.8 mmol/L (ref 3.5–5.1)
Sodium: 135 mmol/L (ref 135–145)
TOTAL PROTEIN: 6.7 g/dL (ref 6.0–8.3)
Total Bilirubin: 0.3 mg/dL (ref 0.3–1.2)

## 2014-04-19 LAB — LACTATE DEHYDROGENASE: LDH: 114 U/L (ref 94–250)

## 2014-04-19 LAB — PROTEIN / CREATININE RATIO, URINE
Creatinine, Urine: 101 mg/dL
Protein Creatinine Ratio: 0.08 (ref 0.00–0.15)
Total Protein, Urine: 8 mg/dL

## 2014-04-19 LAB — URIC ACID: Uric Acid, Serum: 4.4 mg/dL (ref 2.4–7.0)

## 2014-04-19 MED ORDER — HYDROCHLOROTHIAZIDE 12.5 MG PO TABS: 12.5000 mg | ORAL_TABLET | Freq: Every day | ORAL | Status: DC

## 2014-04-19 NOTE — Discharge Instructions (Signed)
Hypertension Hypertension is another name for high blood pressure. High blood pressure forces your heart to work harder to pump blood. A blood pressure reading has two numbers, which includes a higher number over a lower number (example: 110/72). HOME CARE   Have your blood pressure rechecked by your doctor.  Only take medicine as told by your doctor. Follow the directions carefully. The medicine does not work as well if you skip doses. Skipping doses also puts you at risk for problems.  Do not smoke.  Monitor your blood pressure at home as told by your doctor. GET HELP IF:  You think you are having a reaction to the medicine you are taking.  You have repeat headaches or feel dizzy.  You have puffiness (swelling) in your ankles.  You have trouble with your vision. GET HELP RIGHT AWAY IF:   You get a very bad headache and are confused.  You feel weak, numb, or faint.  You get chest or belly (abdominal) pain.  You throw up (vomit).  You cannot breathe very well. MAKE SURE YOU:   Understand these instructions.  Will watch your condition.  Will get help right away if you are not doing well or get worse. Document Released: 09/02/2007 Document Revised: 03/21/2013 Document Reviewed: 01/06/2013 [facility] Patient Information 2015 [place], [place]. This information is not intended to replace advice given to you by your health care provider. Make sure you discuss any questions you have with your health care provider. Postpartum Depression and Baby Blues The postpartum period begins right after the birth of a baby. During this time, there is often a great amount of joy and excitement. It is also a time of many changes in the life of the parents. Regardless of how many times a mother gives birth, each child brings new challenges and dynamics to the family. It is not unusual to have feelings of excitement along with confusing shifts in moods, emotions, and thoughts. All mothers are at risk  of developing postpartum depression or the "baby blues." These mood changes can occur right after giving birth, or they may occur many months after giving birth. The baby blues or postpartum depression can be mild or severe. Additionally, postpartum depression can go away rather quickly, or it can be a long-term condition.  CAUSES Raised hormone levels and the rapid drop in those levels are thought to be a main cause of postpartum depression and the baby blues. A number of hormones change during and after pregnancy. Estrogen and progesterone usually decrease right after the delivery of your baby. The levels of thyroid hormone and various cortisol steroids also rapidly drop. Other factors that play a role in these mood changes include major life events and genetics.  RISK FACTORS If you have any of the following risks for the baby blues or postpartum depression, know what symptoms to watch out for during the postpartum period. Risk factors that may increase the likelihood of getting the baby blues or postpartum depression include:  Having a personal or family history of depression.   Having depression while being pregnant.   Having premenstrual mood issues or mood issues related to oral contraceptives.  Having a lot of life stress.   Having marital conflict.   Lacking a social support network.   Having a baby with special needs.   Having health problems, such as diabetes.  SIGNS AND SYMPTOMS Symptoms of baby blues include:  Brief changes in mood, such as going from extreme happiness to sadness.  Decreased  concentration.   Difficulty sleeping.   Crying spells, tearfulness.   Irritability.   Anxiety.  Symptoms of postpartum depression typically begin within the first month after giving birth. These symptoms include:  Difficulty sleeping or excessive sleepiness.   Marked weight loss.   Agitation.   Feelings of worthlessness.   Lack of interest in activity or  food.  Postpartum psychosis is a very serious condition and can be dangerous. Fortunately, it is rare. Displaying any of the following symptoms is cause for immediate medical attention. Symptoms of postpartum psychosis include:   Hallucinations and delusions.   Bizarre or disorganized behavior.   Confusion or disorientation.  DIAGNOSIS  A diagnosis is made by an evaluation of your symptoms. There are no medical or lab tests that lead to a diagnosis, but there are various questionnaires that a health care provider may use to identify those with the baby blues, postpartum depression, or psychosis. Often, a screening tool called the [place] Postnatal Depression Scale is used to diagnose depression in the postpartum period.  TREATMENT The baby blues usually goes away on its own in 1-2 weeks. Social support is often all that is needed. You will be encouraged to get adequate sleep and rest. Occasionally, you may be given medicines to help you sleep.  Postpartum depression requires treatment because it can last several months or longer if it is not treated. Treatment may include individual or group therapy, medicine, or both to address any social, physiological, and psychological factors that may play a role in the depression. Regular exercise, a healthy diet, rest, and social support may also be strongly recommended.  Postpartum psychosis is more serious and needs treatment right away. Hospitalization is often needed. HOME CARE INSTRUCTIONS  Get as much rest as you can. Nap when the baby sleeps.   Exercise regularly. Some women find yoga and walking to be beneficial.   Eat a balanced and nourishing diet.   Do little things that you enjoy. Have a cup of tea, take a bubble bath, read your favorite magazine, or listen to your favorite music.  Avoid alcohol.   Ask for help with household chores, cooking, grocery shopping, or running errands as needed. Do not try to do everything.   Talk  to people close to you about how you are feeling. Get support from your partner, family members, friends, or other new moms.  Try to stay positive in how you think. Think about the things you are grateful for.   Do not spend a lot of time alone.   Only take over-the-counter or prescription medicine as directed by your health care provider.  Keep all your postpartum appointments.   Let your health care provider know if you have any concerns.  SEEK MEDICAL CARE IF: You are having a reaction to or problems with your medicine. SEEK IMMEDIATE MEDICAL CARE IF:  You have suicidal feelings.   You think you may harm the baby or someone else. MAKE SURE YOU:  Understand these instructions.  Will watch your condition.  Will get help right away if you are not doing well or get worse. Document Released: 12/19/2003 Document Revised: 03/21/2013 Document Reviewed: 12/26/2012 [facility] Patient Information 2015 [place], [place]. This information is not intended to replace advice given to you by your health care provider. Make sure you discuss any questions you have with your health care provider.

## 2014-04-19 NOTE — MAU Note (Signed)
History    [name] is a 39y.o. [id number] who presents after elevated bp noted by smart start nurse. Patient reports that she had a headache for 3 days and it has been unresponsive to ibuprofen and tylenol.  Patient also reports some swelling of her feet and ankles that has subsided.  Denies visual disturbances, no epigastric pain, or numbness and tingling since swelling resided. Patient feels that she stressed with new baby and social issues and regards elevated bp to this.  However, she does admit to HTN issues in the past and that she was taking hydrothiazide prior to pregnancy, but d/c'd herself due to financial hardships and affordability.  Patient also taking Latuda for bipolar disorder diagnosis and states she has not taken this for 2 days.  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  Patient states cigarette usage is the same at about 1/2 pack daily.    Patient Active Problem List   Diagnosis Date Noted  . SVD (spontaneous vaginal delivery) 04/12/2014  . Laceration of periurethral tissue with delivery 04/12/2014  . IUGR (intrauterine growth restriction) 04/11/2014  . Oligohydramnios in third trimester 04/11/2014  . Latex allergy 04/11/2014  . Allergy to penicillin 04/11/2014  . Alcohol use affecting pregnancy 04/11/2014  . Substance abuse--marijuana use early pregnancy 04/11/2014  . Hx of pre-eclampsia in prior pregnancy, currently pregnant--2nd pregnancy 04/11/2014  . Insufficient weight gain during pregnancy 04/11/2014  . Bipolar 1 disorder 10/02/2013  . HTN (hypertension)--no recent/current meds. 10/02/2013  . Smoker 10/02/2013    Chief Complaint  Patient presents with  . Hypertension   HPI  OB History    Gravida Para Term Preterm AB TAB SAB Ectopic Multiple Living   7 3 0 1 4  3 1  0 3      Past Medical History  Diagnosis Date  . Hypertension   . Bipolar 1 disorder   . History of miscarriage      Past Surgical History  Procedure Laterality Date  . No past surgeries    . Tubal ligation N/A 04/13/2014    Procedure: POST PARTUM TUBAL LIGATION;  Surgeon: Jaymes GraffNaima Dillard, MD;  Location: [facility];  Service: Gynecology;  Laterality: N/A;    Family History  Problem Relation Age of Onset  . Cancer Mother   . Cancer Son   . Cancer Maternal Grandmother   . Cancer Maternal Grandfather     History  Substance Use Topics  . Smoking status: Current Every Day Smoker -- 0.50 packs/day  . Smokeless tobacco: Never Used  . Alcohol Use: 14.4 oz/week    24 Cans of beer per week    Allergies:  Allergies  Allergen Reactions  . Latex Rash  . Penicillins Rash    Prescriptions prior to admission  Medication Sig Dispense Refill Last Dose  . acetaminophen (TYLENOL) 500 MG tablet Take 1,000 mg by mouth every 6 (six) hours as needed for mild pain.   04/19/2014 at 1230  . doxylamine, Sleep, (UNISOM) 25 MG tablet Take 25 mg by mouth at bedtime as needed.   04/18/2014 at Unknown time  . ibuprofen (ADVIL,MOTRIN) 600 MG tablet Take 1 tablet (600 mg total) by mouth every 6 (six) hours. 30 tablet 0 04/19/2014 at 0500  . lurasidone (LATUDA) 40 MG TABS tablet Take 40 mg by mouth daily with breakfast.   Past Week at Unknown time  . omeprazole (PRILOSEC) 20 MG capsule Take 20 mg by  mouth daily.   04/19/2014 at Unknown time  . oxyCODONE-acetaminophen (PERCOCET/ROXICET) 5-325 MG per tablet Take 1 tablet by mouth every 4 (four) hours as needed (for pain scale less than 7). 30 tablet 0 04/19/2014 at 1330  . Prenatal Vit-Fe Fumarate-FA (PRENATAL MULTIVITAMIN) TABS tablet Take 1 tablet by mouth daily at 12 noon.   04/19/2014 at Unknown time  . ferrous sulfate 325 (65 FE) MG tablet Take 1 tablet (325 mg total) by mouth 2 (two) times daily with a meal. (Patient not taking: Reported on 04/19/2014) 60 tablet 3 Not Taking at Unknown time  . nicotine (NICODERM CQ - DOSED IN MG/24 HOURS) 21 mg/24hr patch Place 1 patch (21 mg  total) onto the skin daily. (Patient not taking: Reported on 04/19/2014) 28 patch 0 Not Taking at Unknown time    Review of Systems  Constitutional: Negative.   Eyes: Negative.   Respiratory: Positive for shortness of breath.   Gastrointestinal: Positive for constipation. Negative for nausea, vomiting, abdominal pain and diarrhea.  Genitourinary: Negative.   Musculoskeletal: Negative.   Neurological: Positive for headaches.    See HPI Above Physical Exam   Blood pressure 161/82, pulse 71, temperature 98.6 F (37 C), temperature source Oral, resp. rate 18, height  (1.676 m), weight 118 lb 8 oz (53.751 kg), last menstrual period 07/29/2013, unknown if currently breastfeeding.    Results for orders placed or performed during the hospital encounter of 04/19/14 (from the past 24 hour(s))  Lactate dehydrogenase     Status: None   Collection Time: 04/19/14  4:15 PM  Result Value Ref Range   LDH 114 94 - 250 U/L  Uric acid     Status: None   Collection Time: 04/19/14  4:15 PM  Result Value Ref Range   Uric Acid, Serum 4.4 2.4 - 7.0 mg/dL  Comprehensive metabolic panel     Status: Abnormal   Collection Time: 04/19/14  4:15 PM  Result Value Ref Range   Sodium 135 135 - 145 mmol/L   Potassium 3.8 3.5 - 5.1 mmol/L   Chloride 105 96 - 112 mEq/L   CO2 24 19 - 32 mmol/L   Glucose, Bld 103 (H) 70 - 99 mg/dL   BUN 11 6 - 23 mg/dL   Creatinine, Ser 4.54 0.50 - 1.10 mg/dL   Calcium 8.5 8.4 - 09.8 mg/dL   Total Protein 6.7 6.0 - 8.3 g/dL   Albumin 3.0 (L) 3.5 - 5.2 g/dL   AST 15 0 - 37 U/L   ALT 19 0 - 35 U/L   Alkaline Phosphatase 115 39 - 117 U/L   Total Bilirubin 0.3 0.3 - 1.2 mg/dL   GFR calc non Af Amer >90 >90 mL/min   GFR calc Af Amer >90 >90 mL/min   Anion gap 6 5 - 15  CBC     Status: Abnormal   Collection Time: 04/19/14  4:15 PM  Result Value Ref Range   WBC 7.5 4.0 - 10.5 K/uL   RBC 2.84 (L) 3.87 - 5.11 MIL/uL   Hemoglobin 8.8 (L) 12.0 - 15.0 g/dL   HCT 11.9 (L) 14.7 -  46.0 %   MCV 93.7 78.0 - 100.0 fL   MCH 31.0 26.0 - 34.0 pg   MCHC 33.1 30.0 - 36.0 g/dL   RDW 82.9 56.2 - 13.0 %   Platelets 506 (H) 150 - 400 K/uL  Protein / creatinine ratio, urine     Status: None   Collection Time: 04/19/14  4:15 PM  Result Value Ref Range   Creatinine, Urine 101.00 mg/dL   Total Protein, Urine 8 mg/dL   Protein Creatinine Ratio 0.08 0.00 - 0.15    Physical Exam  Constitutional: She is oriented to person, place, and time. She appears well-developed and well-nourished. No distress.  HENT:  Head: Normocephalic and atraumatic.  Eyes: EOM are normal.  Neck: Normal range of motion.  Cardiovascular: Normal rate, regular rhythm and normal heart sounds.   Respiratory: Effort normal. No respiratory distress. She has wheezes.  GI: Bowel sounds are normal. She exhibits no distension. There is tenderness in the periumbilical area. There is no CVA tenderness.  Tenderness Appropriate to BTL incision site  Musculoskeletal: Normal range of motion.  Neurological: She is oriented to person, place, and time.  Skin: Skin is warm and dry.  Psychiatric: She has a normal mood and affect.    ED Course  Assessment: 1 week Postpartum  Elevated BP H/O HTN  Plan: -PE as above -PIH Labs -Await results and consult accordingly  Follow Up (1745) -Lab results-WNL -Dr. Carmela Hurt consulted regarding history and current BP -Start HCTZ at 12.5mg  -Schedule appt for bp check, in office, in 1 week -Encouraged to call if any questions or concerns arise prior to next scheduled office visit.  -Discharged to home in stable condition  Arlayne Liggins LYNN CNM, MSN 04/19/2014 4:31 PM

## 2014-04-19 NOTE — MAU Note (Signed)
Pt post partum 04/12/14. Baby love nurse checked her b/p; today it was 143/92. Pt has had headache x 3 days. Ibuprofen does not help. Denies vision changes or abd pain.

## 2014-11-07 ENCOUNTER — Emergency Department (HOSPITAL_COMMUNITY): Payer: Medicaid Other

## 2014-11-07 ENCOUNTER — Encounter (HOSPITAL_COMMUNITY): Payer: Self-pay

## 2014-11-07 ENCOUNTER — Emergency Department (HOSPITAL_COMMUNITY)
Admission: EM | Admit: 2014-11-07 | Discharge: 2014-11-07 | Disposition: A | Discharge status: Home / Self Care | Payer: Medicaid Other | Attending: Emergency Medicine | Admitting: Emergency Medicine

## 2014-11-07 DIAGNOSIS — Y998 Other external cause status: Secondary | ICD-10-CM | POA: Insufficient documentation

## 2014-11-07 DIAGNOSIS — Z79899 Other long term (current) drug therapy: Secondary | ICD-10-CM | POA: Insufficient documentation

## 2014-11-07 DIAGNOSIS — S99912A Unspecified injury of left ankle, initial encounter: Secondary | ICD-10-CM | POA: Diagnosis present

## 2014-11-07 DIAGNOSIS — Z8659 Personal history of other mental and behavioral disorders: Secondary | ICD-10-CM | POA: Diagnosis not present

## 2014-11-07 DIAGNOSIS — Y9389 Activity, other specified: Secondary | ICD-10-CM | POA: Insufficient documentation

## 2014-11-07 DIAGNOSIS — Z91040 Latex allergy status: Secondary | ICD-10-CM | POA: Insufficient documentation

## 2014-11-07 DIAGNOSIS — W11XXXA Fall on and from ladder, initial encounter: Secondary | ICD-10-CM | POA: Insufficient documentation

## 2014-11-07 DIAGNOSIS — I10 Essential (primary) hypertension: Secondary | ICD-10-CM | POA: Diagnosis not present

## 2014-11-07 DIAGNOSIS — Z880 Allergy status to penicillin: Secondary | ICD-10-CM | POA: Insufficient documentation

## 2014-11-07 DIAGNOSIS — Y9289 Other specified places as the place of occurrence of the external cause: Secondary | ICD-10-CM | POA: Diagnosis not present

## 2014-11-07 DIAGNOSIS — S93402A Sprain of unspecified ligament of left ankle, initial encounter: Secondary | ICD-10-CM | POA: Diagnosis not present

## 2014-11-07 DIAGNOSIS — Z720 Tobacco use: Secondary | ICD-10-CM | POA: Diagnosis not present

## 2014-11-07 MED ORDER — HYDROCODONE-ACETAMINOPHEN 5-325 MG PO TABS
1.0000 | ORAL_TABLET | Freq: Once | ORAL | Status: AC
  Administered 2014-11-07: 1 via ORAL
  Filled 2014-11-07: qty 1

## 2014-11-07 MED ORDER — IBUPROFEN 800 MG PO TABS
800.0000 mg | ORAL_TABLET | Freq: Once | ORAL | Status: AC
  Administered 2014-11-07: 800 mg via ORAL
  Filled 2014-11-07: qty 1

## 2014-11-07 MED ORDER — IBUPROFEN 800 MG PO TABS: 800.0000 mg | ORAL_TABLET | Freq: Three times a day (TID) | ORAL | Status: DC

## 2014-11-07 NOTE — Discharge Instructions (Signed)
Ankle Sprain °An ankle sprain is an injury to the strong, fibrous tissues (ligaments) that hold the bones of your ankle joint together.  °CAUSES °An ankle sprain is usually caused by a fall or by twisting your ankle. Ankle sprains most commonly occur when you step on the outer edge of your foot, and your ankle turns inward. People who participate in sports are more prone to these types of injuries.  °SYMPTOMS  °· Pain in your ankle. The pain may be present at rest or only when you are trying to stand or walk. °· Swelling. °· Bruising. Bruising may develop immediately or within 1 to 2 days after your injury. °· Difficulty standing or walking, particularly when turning corners or changing directions. °DIAGNOSIS  °Your caregiver will ask you details about your injury and perform a physical exam of your ankle to determine if you have an ankle sprain. During the physical exam, your caregiver will press on and apply pressure to specific areas of your foot and ankle. Your caregiver will try to move your ankle in certain ways. An X-ray exam may be done to be sure a bone was not broken or a ligament did not separate from one of the bones in your ankle (avulsion fracture).  °TREATMENT  °Certain types of braces can help stabilize your ankle. Your caregiver can make a recommendation for this. Your caregiver may recommend the use of medicine for pain. If your sprain is severe, your caregiver may refer you to a surgeon who helps to restore function to parts of your skeletal system (orthopedist) or a physical therapist. °HOME CARE INSTRUCTIONS  °· Apply ice to your injury for 1-2 days or as directed by your caregiver. Applying ice helps to reduce inflammation and pain. °· Put ice in a plastic bag. °· Place a towel between your skin and the bag. °· Leave the ice on for 15-20 minutes at a time, every 2 hours while you are awake. °· Only take over-the-counter or prescription medicines for pain, discomfort, or fever as directed by  your caregiver. °· Elevate your injured ankle above the level of your heart as much as possible for 2-3 days. °· If your caregiver recommends crutches, use them as instructed. Gradually put weight on the affected ankle. Continue to use crutches or a cane until you can walk without feeling pain in your ankle. °· If you have a plaster splint, wear the splint as directed by your caregiver. Do not rest it on anything harder than a pillow for the first 24 hours. Do not put weight on it. Do not get it wet. You may take it off to take a shower or bath. °· You may have been given an elastic bandage to wear around your ankle to provide support. If the elastic bandage is too tight (you have numbness or tingling in your foot or your foot becomes cold and blue), adjust the bandage to make it comfortable. °· If you have an air splint, you may blow more air into it or let air out to make it more comfortable. You may take your splint off at night and before taking a shower or bath. Wiggle your toes in the splint several times per day to decrease swelling. °SEEK MEDICAL CARE IF:  °· You have rapidly increasing bruising or swelling. °· Your toes feel extremely cold or you lose feeling in your foot. °· Your pain is not relieved with medicine. °SEEK IMMEDIATE MEDICAL CARE IF: °· Your toes are numb or blue. °·   You have severe pain that is increasing. °MAKE SURE YOU:  °· Understand these instructions. °· Will watch your condition. °· Will get help right away if you are not doing well or get worse. °Document Released: 03/16/2005 Document Revised: 12/09/2011 Document Reviewed: 03/28/2011 °[employer]® Patient Information ©2015 [employer]. This information is not intended to replace advice given to you by your health care provider. Make sure you discuss any questions you have with your health care provider. °Cryotherapy °Cryotherapy means treatment with cold. Ice or gel packs can be used to reduce both pain and swelling. Ice is the most  helpful within the first 24 to 48 hours after an injury or flare-up from overusing a muscle or joint. Sprains, strains, spasms, burning pain, shooting pain, and aches can all be eased with ice. Ice can also be used when recovering from surgery. Ice is effective, has very few side effects, and is safe for most people to use. °PRECAUTIONS  °Ice is not a safe treatment option for people with: °· Raynaud phenomenon. This is a condition affecting small blood vessels in the extremities. Exposure to cold may cause your problems to return. °· Cold hypersensitivity. There are many forms of cold hypersensitivity, including: °¨ Cold urticaria. Red, itchy hives appear on the skin when the tissues begin to warm after being iced. °¨ Cold erythema. This is a red, itchy rash caused by exposure to cold. °¨ Cold hemoglobinuria. Red blood cells break down when the tissues begin to warm after being iced. The hemoglobin that carry oxygen are passed into the urine because they cannot combine with blood proteins fast enough. °· Numbness or altered sensitivity in the area being iced. °If you have any of the following conditions, do not use ice until you have discussed cryotherapy with your caregiver: °· Heart conditions, such as arrhythmia, angina, or chronic heart disease. °· High blood pressure. °· Healing wounds or open skin in the area being iced. °· Current infections. °· Rheumatoid arthritis. °· Poor circulation. °· Diabetes. °Ice slows the blood flow in the region it is applied. This is beneficial when trying to stop inflamed tissues from spreading irritating chemicals to surrounding tissues. However, if you expose your skin to cold temperatures for too long or without the proper protection, you can damage your skin or nerves. Watch for signs of skin damage due to cold. °HOME CARE INSTRUCTIONS °Follow these tips to use ice and cold packs safely. °· Place a dry or damp towel between the ice and skin. A damp towel will cool the skin  more quickly, so you may need to shorten the time that the ice is used. °· For a more rapid response, add gentle compression to the ice. °· Ice for no more than 10 to 20 minutes at a time. The bonier the area you are icing, the less time it will take to get the benefits of ice. °· Check your skin after 5 minutes to make sure there are no signs of a poor response to cold or skin damage. °· Rest 20 minutes or more between uses. °· Once your skin is numb, you can end your treatment. You can test numbness by very lightly touching your skin. The touch should be so light that you do not see the skin dimple from the pressure of your fingertip. When using ice, most people will feel these normal sensations in this order: cold, burning, aching, and numbness. °· Do not use ice on someone who cannot communicate their responses to pain,   such as small children or people with dementia. °HOW TO MAKE AN ICE PACK °Ice packs are the most common way to use ice therapy. Other methods include ice massage, ice baths, and cryosprays. Muscle creams that cause a cold, tingly feeling do not offer the same benefits that ice offers and should not be used as a substitute unless recommended by your caregiver. °To make an ice pack, do one of the following: °· Place crushed ice or a bag of frozen vegetables in a sealable plastic bag. Squeeze out the excess air. Place this bag inside another plastic bag. Slide the bag into a pillowcase or place a damp towel between your skin and the bag. °· Mix 3 parts water with 1 part rubbing alcohol. Freeze the mixture in a sealable plastic bag. When you remove the mixture from the freezer, it will be slushy. Squeeze out the excess air. Place this bag inside another plastic bag. Slide the bag into a pillowcase or place a damp towel between your skin and the bag. °SEEK MEDICAL CARE IF: °· You develop white spots on your skin. This may give the skin a blotchy (mottled) appearance. °· Your skin turns blue or  pale. °· Your skin becomes waxy or hard. °· Your swelling gets worse. °MAKE SURE YOU:  °· Understand these instructions. °· Will watch your condition. °· Will get help right away if you are not doing well or get worse. °Document Released: 11/10/2010 Document Revised: 07/31/2013 Document Reviewed: 11/10/2010 °[employer]® Patient Information ©2015 [employer]. This information is not intended to replace advice given to you by your health care provider. Make sure you discuss any questions you have with your health care provider. ° °

## 2014-11-07 NOTE — ED Provider Notes (Signed)
CSN: [id number]     Arrival date & time 11/07/14  2032 History  This chart was scribed for non-physician practitioner, Leigh Aurora, PA-C working with Melene Plan, DO by Placido Sou, ED scribe. This patient was seen in room WTR8/WTR8 and the patient's care was started at 10:16 PM.   Chief Complaint  Patient presents with  . Ankle Pain   The history is provided by the patient. No language interpreter was used.    HPI Comments: [name] is a 39 y.o. female who presents to the Emergency Department complaining of a fall that occurred 5 days ago. Pt notes that she fell off a ladder which resulted in pain and swelling to her left ankle. She has been able to work but notes that she stands for long periods of time at work and due to this is experiencing a  worsening of her pain. She denies driving herself to the ED. Pt denies any other associated symptoms.   Past Medical History  Diagnosis Date  . Hypertension   . Bipolar 1 disorder   . History of miscarriage    Past Surgical History  Procedure Laterality Date  . No past surgeries    . Tubal ligation N/A 04/13/2014    Procedure: POST PARTUM TUBAL LIGATION;  Surgeon: [name], MD;  Location: [facility];  Service: Gynecology;  Laterality: N/A;   Family History  Problem Relation Age of Onset  . Cancer Mother   . Cancer Son   . Cancer Maternal Grandmother   . Cancer Maternal Grandfather    Social History  Substance Use Topics  . Smoking status: Current Every Day Smoker -- 0.50 packs/day  . Smokeless tobacco: Never Used  . Alcohol Use: 14.4 oz/week    24 Cans of beer per week   OB History    Gravida Para Term Preterm AB TAB SAB Ectopic Multiple Living   7 3 0 1 4  3 1  0 3     Review of Systems  Musculoskeletal: Positive for joint swelling and arthralgias.  Skin: Negative for wound.  Neurological: Negative for numbness.   Allergies  Latex and Penicillins  Home Medications   Prior to Admission medications   Medication  Sig Start Date End Date Taking? Authorizing Provider  acetaminophen (TYLENOL) 500 MG tablet Take 1,000 mg by mouth every 6 (six) hours as needed for mild pain.    Historical Provider, MD  doxylamine, Sleep, (UNISOM) 25 MG tablet Take 25 mg by mouth at bedtime as needed.    Historical Provider, MD  ferrous sulfate 325 (65 FE) MG tablet Take 1 tablet (325 mg total) by mouth 2 (two) times daily with a meal. Patient not taking: Reported on 04/19/2014 04/14/14   Nigel Bridgeman, CNM  hydrochlorothiazide (HYDRODIURIL) 12.5 MG tablet Take 1 tablet (12.5 mg total) by mouth daily. 04/19/14   Gerrit Heck, CNM  ibuprofen (ADVIL,MOTRIN) 600 MG tablet Take 1 tablet (600 mg total) by mouth every 6 (six) hours. 04/14/14   Nigel Bridgeman, CNM  lurasidone (LATUDA) 40 MG TABS tablet Take 40 mg by mouth daily with breakfast.    Historical Provider, MD  nicotine (NICODERM CQ - DOSED IN MG/24 HOURS) 21 mg/24hr patch Place 1 patch (21 mg total) onto the skin daily. Patient not taking: Reported on 04/19/2014 04/14/14   Nigel Bridgeman, CNM  omeprazole (PRILOSEC) 20 MG capsule Take 20 mg by mouth daily.    Historical Provider, MD  oxyCODONE-acetaminophen (PERCOCET/ROXICET) 5-325 MG per tablet Take 1 tablet  by mouth every 4 (four) hours as needed (for pain scale less than 7). 04/14/14   Nigel Bridgeman, CNM  Prenatal Vit-Fe Fumarate-FA (PRENATAL MULTIVITAMIN) TABS tablet Take 1 tablet by mouth daily at 12 noon.    Historical Provider, MD   BP 147/93 mmHg  Pulse 88  Temp(Src) 97.5 F (36.4 C) (Oral)  Resp 18  SpO2 96%  LMP 10/31/2014 Physical Exam  Constitutional: She is oriented to person, place, and time. She appears well-developed and well-nourished. No distress.  HENT:  Head: Normocephalic and atraumatic.  Mouth/Throat: Oropharynx is clear and moist.  Eyes: Conjunctivae and EOM are normal. Pupils are equal, round, and reactive to light.  Neck: Normal range of motion. Neck supple.  Cardiovascular: Normal rate.    Pulmonary/Chest: Effort normal.  Abdominal: Soft.  Musculoskeletal: Normal range of motion.  Left ankle minimally swollen. No bony deformity. No ankle joint laxity. FROM all digits.   Neurological: She is alert and oriented to person, place, and time.  Skin: Skin is warm and dry.  Psychiatric: She has a normal mood and affect. Her behavior is normal.  Nursing note and vitals reviewed.  ED Course  Procedures  DIAGNOSTIC STUDIES: Oxygen Saturation is 96% on RA, normal by my interpretation.    COORDINATION OF CARE: 10:18 PM Discussed treatment plan with pt at bedside and pt agreed to plan.  Labs Review Labs Reviewed - No data to display  Imaging Review No results found.   EKG Interpretation None      MDM   Final diagnoses:  None    1. Ankle sprain  ASO provided for comfort. Encouraged ortho follow up if pain persists. Recommended ibuprofen for pain relief.   I personally performed the services described in this documentation, which was scribed in my presence. The recorded information has been reviewed and is accurate.     Elpidio Anis, PA-C 11/11/14 2317  Melene Plan, DO 11/12/14 Paulo Fruit

## 2014-11-07 NOTE — ED Notes (Signed)
Pt fell off a ladder Friday and injured her left ankle, her ankle is swollen and bruised

## 2015-12-05 ENCOUNTER — Emergency Department (HOSPITAL_COMMUNITY)
Admission: EM | Admit: 2015-12-05 | Discharge: 2015-12-05 | Disposition: A | Discharge status: Home / Self Care | Payer: Medicaid Other | Attending: Emergency Medicine | Admitting: Emergency Medicine

## 2015-12-05 ENCOUNTER — Encounter (HOSPITAL_COMMUNITY): Payer: Self-pay | Admitting: *Deleted

## 2015-12-05 DIAGNOSIS — F17200 Nicotine dependence, unspecified, uncomplicated: Secondary | ICD-10-CM | POA: Insufficient documentation

## 2015-12-05 DIAGNOSIS — H5789 Other specified disorders of eye and adnexa: Secondary | ICD-10-CM

## 2015-12-05 DIAGNOSIS — Z79899 Other long term (current) drug therapy: Secondary | ICD-10-CM | POA: Insufficient documentation

## 2015-12-05 DIAGNOSIS — Z791 Long term (current) use of non-steroidal anti-inflammatories (NSAID): Secondary | ICD-10-CM | POA: Insufficient documentation

## 2015-12-05 DIAGNOSIS — H578 Other specified disorders of eye and adnexa: Secondary | ICD-10-CM | POA: Insufficient documentation

## 2015-12-05 DIAGNOSIS — R220 Localized swelling, mass and lump, head: Secondary | ICD-10-CM | POA: Insufficient documentation

## 2015-12-05 DIAGNOSIS — Z7982 Long term (current) use of aspirin: Secondary | ICD-10-CM | POA: Insufficient documentation

## 2015-12-05 DIAGNOSIS — I10 Essential (primary) hypertension: Secondary | ICD-10-CM | POA: Insufficient documentation

## 2015-12-05 DIAGNOSIS — Z91040 Latex allergy status: Secondary | ICD-10-CM | POA: Insufficient documentation

## 2015-12-05 MED ORDER — TETRACAINE HCL 0.5 % OP SOLN
2.0000 [drp] | Freq: Once | OPHTHALMIC | Status: AC
  Administered 2015-12-05: 2 [drp] via OPHTHALMIC
  Filled 2015-12-05: qty 4

## 2015-12-05 MED ORDER — FLUORESCEIN SODIUM 1 MG OP STRP
1.0000 | ORAL_STRIP | Freq: Once | OPHTHALMIC | Status: AC
  Administered 2015-12-05: 1 via OPHTHALMIC
  Filled 2015-12-05: qty 1

## 2015-12-05 MED ORDER — ERYTHROMYCIN 5 MG/GM OP OINT: TOPICAL_OINTMENT | OPHTHALMIC | 0 refills | Status: DC

## 2015-12-05 NOTE — ED Provider Notes (Signed)
[facility]-EMERGENCY DEPT Provider Note   CSN: [id number] Arrival date & time: 12/05/15  0920     History   Chief Complaint Chief Complaint  Patient presents with  . Eye Problem    HPI [name] is a 40 y.o. female.  [name] is a 40 y.o. female with history of bipolar disorder and HTN presents to ED with complaint of eyelid swelling and itching. Patient states she first noticed upper eye lid swelling on Monday that has progressively worsened to include the lower eye lid. She has associated eye discharge, matting of eyelids, and itching eyes. She has tried OTC pink eye drops with minimal relief. She applied a hot tea bag that provided some relief. She denies any changes in vision, photophobia, trauma, chemical exposure, foreign body sensation, eye redness, eye pain, headache, nausea, vomiting, neck pain, trouble swallowing, congestion, or fever. No recent changes to soaps, lotions, or make up. Does not think she was bit by anything. Patient does endorse roommate has had "eye problems" with lid swelling as well - they have shared towels. She does have an established ophthalmologist who she saw two weeks ago and is in need of new glasses.       Past Medical History:  Diagnosis Date  . Bipolar 1 disorder (HCC)   . History of miscarriage   . Hypertension     Patient Active Problem List   Diagnosis Date Noted  . SVD (spontaneous vaginal delivery) 04/12/2014  . Laceration of periurethral tissue with delivery 04/12/2014  . IUGR (intrauterine growth restriction) 04/11/2014  . Oligohydramnios in third trimester 04/11/2014  . Latex allergy 04/11/2014  . Allergy to penicillin 04/11/2014  . Alcohol use affecting pregnancy 04/11/2014  . Substance abuse--marijuana use early pregnancy 04/11/2014  . Hx of pre-eclampsia in prior pregnancy, currently pregnant--2nd pregnancy 04/11/2014  . Insufficient weight gain during pregnancy 04/11/2014  . Bipolar 1 disorder (HCC) 10/02/2013  . HTN  (hypertension)--no recent/current meds. 10/02/2013  . Smoker 10/02/2013    Past Surgical History:  Procedure Laterality Date  . NO PAST SURGERIES    . TUBAL LIGATION N/A 04/13/2014   Procedure: POST PARTUM TUBAL LIGATION;  Surgeon: [name], MD;  Location: [facility];  Service: Gynecology;  Laterality: N/A;    OB History    Gravida Para Term Preterm AB Living   7 3 0 1 4 3    SAB TAB Ectopic Multiple Live Births   3   1 0 3       Home Medications    Prior to Admission medications   Medication Sig Start Date End Date Taking? Authorizing Provider  amitriptyline (ELAVIL) 25 MG tablet Take 25 mg by mouth at bedtime.   Yes Historical Provider, MD  aspirin 81 MG chewable tablet Chew 324 mg by mouth once as needed for headache.   Yes Historical Provider, MD  gabapentin (NEURONTIN) 300 MG capsule Take 300-600 mg by mouth See admin instructions. Take 300 mg every morning and 600 mg every night at bedtime.   Yes Historical Provider, MD  ibuprofen (ADVIL,MOTRIN) 200 MG tablet Take 200 mg by mouth every 6 (six) hours as needed for mild pain or moderate pain.   Yes Historical Provider, MD  omeprazole (PRILOSEC) 20 MG capsule Take 20 mg by mouth daily.   Yes Historical Provider, MD  QUEtiapine (SEROQUEL) 100 MG tablet Take 150 mg by mouth at bedtime.   Yes Historical Provider, MD  erythromycin ophthalmic ointment Place a 1/2 inch ribbon of ointment  into the lower eyelid up to four times daily. 12/05/15   Lona KettleAshley Laurel Najae Filsaime, PA-C    Family History Family History  Problem Relation Age of Onset  . Cancer Mother   . Cancer Son   . Cancer Maternal Grandmother   . Cancer Maternal Grandfather     Social History Social History  Substance Use Topics  . Smoking status: Current Every Day Smoker    Packs/day: 0.50  . Smokeless tobacco: Never Used  . Alcohol use 14.4 oz/week    24 Cans of beer per week     Allergies   Latex and Penicillins   Review of Systems Review of Systems    Constitutional: Negative for fever.  HENT: Negative for congestion, sinus pressure and trouble swallowing.   Eyes: Positive for discharge and itching. Negative for photophobia, pain, redness and visual disturbance.  Respiratory: Negative for shortness of breath.   Gastrointestinal: Negative for nausea and vomiting.  Musculoskeletal: Negative for neck pain.  Neurological: Negative for headaches.     Physical Exam Updated Vital Signs BP 149/100 (BP Location: Right Arm)   Pulse 78   Temp 98 F (36.7 C) (Oral)   Resp 17   Ht 5\' 6"  (1.676 m)   Wt 62.1 kg   LMP 11/30/2015   SpO2 100%   BMI 22.11 kg/m   Physical Exam  Constitutional: She appears well-developed and well-nourished. No distress.  HENT:  Head: Normocephalic and atraumatic.  Mouth/Throat: Oropharynx is clear and moist. No oropharyngeal exudate.  Eyes: Conjunctivae and EOM are normal. Pupils are equal, round, and reactive to light. Lids are everted and swept, no foreign bodies found. Right eye exhibits discharge. Right eye exhibits no exudate and no hordeolum. No foreign body present in the right eye. Left eye exhibits no discharge, no exudate and no hordeolum. No foreign body present in the left eye. No scleral icterus.  Visual acuity is 20/40 R, 20/30 L, corrected - (right eye chronically worsening vision). Periorbital swelling. No proptosis. No eye lid lacerations. Lids are soft. No TTP or bony defects of orbital rim. PERRL. On fluorescein stain - no corneal foreign bodies, abrasions, or ulcerations.   Neck: Normal range of motion and phonation normal. Neck supple. No neck rigidity. Normal range of motion present.  Cardiovascular: Normal rate.   Pulmonary/Chest: Effort normal. No stridor. No respiratory distress.  Abdominal: Soft. Bowel sounds are normal. She exhibits no distension. There is no tenderness. There is no rigidity, no rebound, no guarding and no CVA tenderness.  Musculoskeletal: Normal range of motion.   Lymphadenopathy:    She has no cervical adenopathy.  Neurological: She is alert. She is not disoriented. Coordination and gait normal. GCS eye subscore is 4. GCS verbal subscore is 5. GCS motor subscore is 6.  Skin: Skin is warm and dry. She is not diaphoretic.  Psychiatric: She has a normal mood and affect. Her behavior is normal.     ED Treatments / Results  Labs (all labs ordered are listed, but only abnormal results are displayed) Labs Reviewed - No data to display  EKG  EKG Interpretation None       Radiology No results found.  Procedures Procedures (including critical care time)  Medications Ordered in ED Medications  tetracaine (PONTOCAINE) 0.5 % ophthalmic solution 2 drop (2 drops Right Eye Given by Other 12/05/15 1104)  fluorescein ophthalmic strip 1 strip (1 strip Right Eye Given by Other 12/05/15 1103)     Initial Impression / Assessment and Plan /  ED Course  I have reviewed the triage vital signs and the nursing notes.  Pertinent labs & imaging results that were available during my care of the patient were reviewed by me and considered in my medical decision making (see chart for details).  Clinical Course    Patient presents to ED right eyelid swelling. Patient is afebrile and non-toxic appearing in NAD. Vital signs remarkable for mildly elevated BP. Physical exam remarkable for right eyelid swelling, clear eye discharge, eyelash crusting. No conjunctival injection. No photophobia; PERRL. No entrapment; EOMs intact without pain. Visual acuity intact. No corneal abrasions, ulcerations, or dendritic lesions on woods lamp. Suspect possible ?blepharatis vs. ?Dermatitis. Will tx with eyelid hygiene, warm compresses, artifical tears, and topical ABX ointment.   Discussed plan with patient. Rx erythromycin ointment. Follow up with ophthalmologist in 2 days for re-evaluation. Return precautions discussed. Patient voiced understanding and is agreeable.    Final  Clinical Impressions(s) / ED Diagnoses   Final diagnoses:  Eye irritation  Eye swelling    New Prescriptions New Prescriptions   ERYTHROMYCIN OPHTHALMIC OINTMENT    Place a 1/2 inch ribbon of ointment into the lower eyelid up to four times daily.     Lona Kettle, PA-C 12/05/15 [address] [place], [place] 12/05/15 1216    Gerhard Munch, MD 12/05/15 1740

## 2015-12-05 NOTE — ED Triage Notes (Signed)
Pt reports R eye swelling, itching and pain x 2 days.  Pt states it's also weeping.  Denies visual changes from that eye

## 2015-12-05 NOTE — Discharge Instructions (Signed)
Read the information below.  You have inflammation of the eyelids. It is important that you practice good eyelid hygiene. Apply warm compresses for 5-10 minutes two to four times a day. Wash and massage eyelids with warm water and wash eyelids to remove any debris.  You can use artifical tears such as systane or visine for symptomatic relief.  I have prescribed an antibiotic ointment to be applied. Use as directed.  Please call your ophthalmologist today to schedule a follow up appointment for tomorrow.    Use the prescribed medication as directed.  Please discuss all new medications with your pharmacist.  You may return to the Emergency Department at any time for worsening condition or any new symptoms that concern you. Return to ED if you develop worsening symptoms, have changes in vision, develop fever, headache, vomiting, eye pain.

## 2016-05-03 ENCOUNTER — Emergency Department (HOSPITAL_COMMUNITY)
Admission: EM | Admit: 2016-05-03 | Discharge: 2016-05-03 | Disposition: A | Discharge status: Home / Self Care | Payer: Medicaid Other | Attending: Emergency Medicine | Admitting: Emergency Medicine

## 2016-05-03 ENCOUNTER — Encounter (HOSPITAL_COMMUNITY): Payer: Self-pay

## 2016-05-03 DIAGNOSIS — F17200 Nicotine dependence, unspecified, uncomplicated: Secondary | ICD-10-CM | POA: Insufficient documentation

## 2016-05-03 DIAGNOSIS — R69 Illness, unspecified: Secondary | ICD-10-CM

## 2016-05-03 DIAGNOSIS — Z7982 Long term (current) use of aspirin: Secondary | ICD-10-CM | POA: Insufficient documentation

## 2016-05-03 DIAGNOSIS — Z91040 Latex allergy status: Secondary | ICD-10-CM | POA: Insufficient documentation

## 2016-05-03 DIAGNOSIS — Z79899 Other long term (current) drug therapy: Secondary | ICD-10-CM | POA: Insufficient documentation

## 2016-05-03 DIAGNOSIS — I10 Essential (primary) hypertension: Secondary | ICD-10-CM | POA: Insufficient documentation

## 2016-05-03 DIAGNOSIS — B9789 Other viral agents as the cause of diseases classified elsewhere: Secondary | ICD-10-CM

## 2016-05-03 DIAGNOSIS — J069 Acute upper respiratory infection, unspecified: Secondary | ICD-10-CM

## 2016-05-03 DIAGNOSIS — J111 Influenza due to unidentified influenza virus with other respiratory manifestations: Secondary | ICD-10-CM | POA: Insufficient documentation

## 2016-05-03 MED ORDER — OSELTAMIVIR PHOSPHATE 75 MG PO CAPS: 75.0000 mg | ORAL_CAPSULE | Freq: Two times a day (BID) | ORAL | 0 refills | Status: DC

## 2016-05-03 MED ORDER — FLUTICASONE PROPIONATE 50 MCG/ACT NA SUSP
2.0000 | Freq: Every day | NASAL | 0 refills | Status: DC
Start: 2016-05-03 — End: 2018-02-16

## 2016-05-03 MED ORDER — HYDROCODONE-HOMATROPINE 5-1.5 MG/5ML PO SYRP: 5.0000 mL | ORAL_SOLUTION | Freq: Four times a day (QID) | ORAL | 0 refills | Status: DC | PRN

## 2016-05-03 MED ORDER — GUAIFENESIN 100 MG/5ML PO LIQD
100.0000 mg | ORAL | 0 refills | Status: DC | PRN
Start: 2016-05-03 — End: 2018-02-16

## 2016-05-03 NOTE — ED Provider Notes (Signed)
[facility]-EMERGENCY DEPT Provider Note   CSN: [id number] Arrival date & time: 05/03/16  0845  By signing my name below, I, [name], attest that this documentation has been prepared under the direction and in the presence of [name], PA-C. Electronically Signed: Sonum Gray, Neurosurgeon. 05/03/16. 9:38 AM.  History   Chief Complaint Chief Complaint  Patient presents with  . Cough    The history is provided by the patient. No language interpreter was used.     HPI Comments: [name] is a 41 y.o. female who presents to the Emergency Department complaining of a persistent, non-productive cough for the past 4 days. She has associated sore throat, sneezing, fatigue, rhinorrhea, and congestion. She has tried Mucinex without significant relief. She reports sick contacts at home with similar symptoms. She denies prior history of having the flu. She denies fever, nausea, vomiting, SOB, CP. She is a daily 0.5 ppd smoker; states she has continued to smoke the same amount.   Past Medical History:  Diagnosis Date  . Bipolar 1 disorder (HCC)   . History of miscarriage   . Hypertension     Patient Active Problem List   Diagnosis Date Noted  . SVD (spontaneous vaginal delivery) 04/12/2014  . Laceration of periurethral tissue with delivery 04/12/2014  . IUGR (intrauterine growth restriction) 04/11/2014  . Oligohydramnios in third trimester 04/11/2014  . Latex allergy 04/11/2014  . Allergy to penicillin 04/11/2014  . Alcohol use affecting pregnancy 04/11/2014  . Substance abuse--marijuana use early pregnancy 04/11/2014  . Hx of pre-eclampsia in prior pregnancy, currently pregnant--2nd pregnancy 04/11/2014  . Insufficient weight gain during pregnancy 04/11/2014  . Bipolar 1 disorder (HCC) 10/02/2013  . HTN (hypertension)--no recent/current meds. 10/02/2013  . Smoker 10/02/2013    Past Surgical History:  Procedure Laterality Date  . NO PAST SURGERIES    . TUBAL LIGATION N/A 04/13/2014    Procedure: POST PARTUM TUBAL LIGATION;  Surgeon: [name], MD;  Location: [facility];  Service: Gynecology;  Laterality: N/A;    OB History    Gravida Para Term Preterm AB Living   7 3 0 1 4 3    SAB TAB Ectopic Multiple Live Births   3   1 0 3       Home Medications    Prior to Admission medications   Medication Sig Start Date End Date Taking? Authorizing Provider  amitriptyline (ELAVIL) 25 MG tablet Take 25 mg by mouth at bedtime.    Historical Provider, MD  aspirin 81 MG chewable tablet Chew 324 mg by mouth once as needed for headache.    Historical Provider, MD  erythromycin ophthalmic ointment Place a 1/2 inch ribbon of ointment into the lower eyelid up to four times daily. 12/05/15   Lona Kettle, PA-C  fluticasone [facility]) 50 MCG/ACT nasal spray Place 2 sprays into both nostrils daily. FOR NASAL CONGESTION 05/03/16   [name], PA-C  gabapentin (NEURONTIN) 300 MG capsule Take 300-600 mg by mouth See admin instructions. Take 300 mg every morning and 600 mg every night at bedtime.    Historical Provider, MD  guaiFENesin (ROBITUSSIN) 100 MG/5ML liquid Take 5-10 mLs (100-200 mg total) by mouth every 4 (four) hours as needed for cough. FOR CHEST CONGESTION AND PHLEGM 05/03/16   [name], PA-C  HYDROcodone-homatropine [facility]) 5-1.5 MG/5ML syrup Take 5 mLs by mouth every 6 (six) hours as needed for cough. FOR DISRUPTIVE NIGHT TIME COUGH AND BODY ACHES 05/03/16   [name],  PA-C  ibuprofen (ADVIL,MOTRIN) 200 MG tablet Take 200 mg by mouth every 6 (six) hours as needed for mild pain or moderate pain.    Historical Provider, MD  omeprazole (PRILOSEC) 20 MG capsule Take 20 mg by mouth daily.    Historical Provider, MD  oseltamivir (TAMIFLU) 75 MG capsule Take 1 capsule (75 mg total) by mouth every 12 (twelve) hours. 05/03/16   Liberty Handylaudia J Neeta Storey, PA-C  QUEtiapine (SEROQUEL) 100 MG tablet Take 150 mg by mouth at bedtime.    Historical Provider, MD    Family  History Family History  Problem Relation Age of Onset  . Cancer Mother   . Cancer Son   . Cancer Maternal Grandmother   . Cancer Maternal Grandfather     Social History Social History  Substance Use Topics  . Smoking status: Current Every Day Smoker    Packs/day: 0.50  . Smokeless tobacco: Never Used  . Alcohol use 14.4 oz/week    24 Cans of beer per week     Allergies   Latex and Penicillins   Review of Systems Review of Systems  Constitutional: Negative for fever.  HENT: Positive for congestion, rhinorrhea, sneezing and sore throat.   Eyes: Negative for visual disturbance.  Respiratory: Positive for cough. Negative for shortness of breath.   Cardiovascular: Negative for chest pain.  Gastrointestinal: Negative for abdominal pain, constipation, diarrhea, nausea and vomiting.  Genitourinary: Negative for difficulty urinating.  Musculoskeletal: Negative for arthralgias.  Neurological: Negative for dizziness, weakness, light-headedness and headaches.     Physical Exam Updated Vital Signs BP 160/92 (BP Location: Left Arm)   Pulse 100   Temp 98.3 F (36.8 C) (Oral)   Resp 20   SpO2 100%   Physical Exam  Constitutional: She is oriented to person, place, and time. She appears well-developed and well-nourished. No distress.  NAD.  HENT:  Head: Normocephalic and atraumatic.  Right Ear: External ear normal.  Left Ear: External ear normal.  Nose: Mucosal edema and rhinorrhea present.  Mouth/Throat: Posterior oropharyngeal edema and posterior oropharyngeal erythema present. No oropharyngeal exudate. No tonsillar exudate.  Moist mucous membranes.  Mild mucosal edema with clear discharge bilaterally  Bilateral tonsillar edema and erythema without exudates.  Uvula midline. No trismus.   Eyes: Conjunctivae and EOM are normal. Pupils are equal, round, and reactive to light. No scleral icterus.  Neck: Normal range of motion. Neck supple. No JVD present.  Cardiovascular:  Normal rate, regular rhythm and normal heart sounds.   No murmur heard. Pulmonary/Chest: Effort normal and breath sounds normal. She has no wheezes.  RR within normal limits. SpO2 within normal limits.  Normal breathing effort. Patient speaking in full sentences. No pursed lip breathing. No chest wall retractions. No cyanosis. Chest wall expansion symmetric.  No chest wall tenderness. Lungs CTAB anteriorly and posteriorly without wheezing, rhonchi or crackles.  No egophony.  No JVD. No orthopnea with head of bed flat.   Abdominal: Soft. There is no tenderness.  Musculoskeletal: Normal range of motion. She exhibits no deformity.  Lymphadenopathy:    She has no cervical adenopathy.  Neurological: She is alert and oriented to person, place, and time.  Skin: Skin is warm and dry. Capillary refill takes less than 2 seconds.  Psychiatric: She has a normal mood and affect. Her behavior is normal. Judgment and thought content normal.  Nursing note and vitals reviewed.    ED Treatments / Results  DIAGNOSTIC STUDIES: Oxygen Saturation is 100% on RA, normal by my  interpretation.    COORDINATION OF CARE: 9:38 AM Discussed treatment plan with pt at bedside and pt agreed to plan.  Labs (all labs ordered are listed, but only abnormal results are displayed) Labs Reviewed - No data to display  EKG  EKG Interpretation None       Radiology No results found.  Procedures Procedures (including critical care time)  Medications Ordered in ED Medications - No data to display   Initial Impression / Assessment and Plan / ED Course  I have reviewed the triage vital signs and the nursing notes.  Pertinent labs & imaging results that were available during my care of the patient were reviewed by me and considered in my medical decision making (see chart for details).         Final Clinical Impressions(s) / ED Diagnoses   Final diagnoses:  Viral URI with cough  Influenza-like illness    41 y.o. -year-old female with pertinent pmg of HTN, tobacco use presents to the ED with URI symptoms  4. Symptoms most likely due to a viral URI. On my exam patient is nontoxic appearing, speaking in full sentences. No fever, no tachypnea, no tachycardia, normal oxygen saturations. Lungs are clear to auscultation bilaterally without wheezing, rales, egophony. I do not think that a chest x-ray is indicated at this time as vital signs are within normal limits, there are no signs of consolidation on chest exam, there is no hypoxia. I doubt bacterial bronchitis, pneumonia, PE, ACS. I think that symptoms can be treated conservatively at this point. Given reassuring physical exam and vital signs within normal limits patient will be discharged with symptomatic treatment including rest, fluids, Flonase, Tessalon, Robitussin, Hycodan. Strict ED return precautions given. Patient is aware that a viral URI infection may precede the onset of bacterial bronchitis or pneumonia. Patient is aware of red flag symptoms to monitor for that would warrant return to the ED for further reevaluation. Pt ambulated with pulse ox within normal limits prior to discharge.   New Prescriptions Discharge Medication List as of 05/03/2016 10:03 AM    START taking these medications   Details  fluticasone (FLONASE) 50 MCG/ACT nasal spray Place 2 sprays into both nostrils daily. FOR NASAL CONGESTION, Starting Sun 05/03/2016, Print    guaiFENesin (ROBITUSSIN) 100 MG/5ML liquid Take 5-10 mLs (100-200 mg total) by mouth every 4 (four) hours as needed for cough. FOR CHEST CONGESTION AND PHLEGM, Starting Sun 05/03/2016, Print    HYDROcodone-homatropine (HYCODAN) 5-1.5 MG/5ML syrup Take 5 mLs by mouth every 6 (six) hours as needed for cough. FOR DISRUPTIVE NIGHT TIME COUGH AND BODY ACHES, Starting Sun 05/03/2016, Print    oseltamivir (TAMIFLU) 75 MG capsule Take 1 capsule (75 mg total) by mouth every 12 (twelve) hours., Starting Sun 05/03/2016, Print        I personally performed the services described in this documentation, which was scribed in my presence. The recorded information has been reviewed and is accurate.   [name], PA-C 05/03/16 1056    Lorre Nick, MD 05/06/16 [phone]

## 2016-05-03 NOTE — ED Triage Notes (Signed)
Patient complains of 4 days of non-productive cough and congestion. States entire family has had same, denies fever, no chills. Alert and oriented, NAD. Patient is a smoker.

## 2016-05-03 NOTE — Discharge Instructions (Signed)
Medications: flonase for nasal congestion and pressure, robitussin for chest congestion and phlegm, hycodan for night time cough and body aches and tamiflu for influenza  Drink a lot of water to stay hydrated.   Return if your symptoms affect your breathing

## 2016-05-03 NOTE — ED Notes (Signed)
Declined W/C at D/C and was escorted to lobby by RN. 

## 2017-12-19 ENCOUNTER — Inpatient Hospital Stay (HOSPITAL_COMMUNITY)
Admission: AD | Admit: 2017-12-19 | Discharge: 2017-12-19 | Disposition: A | Discharge status: Home / Self Care | Payer: Self-pay | Attending: Family Medicine | Admitting: Family Medicine

## 2017-12-19 ENCOUNTER — Encounter (HOSPITAL_COMMUNITY): Payer: Self-pay | Admitting: *Deleted

## 2017-12-19 DIAGNOSIS — F17210 Nicotine dependence, cigarettes, uncomplicated: Secondary | ICD-10-CM | POA: Insufficient documentation

## 2017-12-19 DIAGNOSIS — N939 Abnormal uterine and vaginal bleeding, unspecified: Secondary | ICD-10-CM | POA: Diagnosis present

## 2017-12-19 DIAGNOSIS — N3001 Acute cystitis with hematuria: Secondary | ICD-10-CM | POA: Insufficient documentation

## 2017-12-19 DIAGNOSIS — Z880 Allergy status to penicillin: Secondary | ICD-10-CM | POA: Insufficient documentation

## 2017-12-19 DIAGNOSIS — N390 Urinary tract infection, site not specified: Secondary | ICD-10-CM | POA: Diagnosis present

## 2017-12-19 LAB — URINALYSIS, ROUTINE W REFLEX MICROSCOPIC
BILIRUBIN URINE: NEGATIVE
Glucose, UA: NEGATIVE mg/dL
Ketones, ur: NEGATIVE mg/dL
Leukocytes, UA: NEGATIVE
Nitrite: POSITIVE — AB
PROTEIN: NEGATIVE mg/dL
Specific Gravity, Urine: 1.011 (ref 1.005–1.030)
pH: 5 (ref 5.0–8.0)

## 2017-12-19 LAB — WET PREP, GENITAL
CLUE CELLS WET PREP: NONE SEEN
Sperm: NONE SEEN
Trich, Wet Prep: NONE SEEN
Yeast Wet Prep HPF POC: NONE SEEN

## 2017-12-19 LAB — CBC
HCT: 41.2 % (ref 36.0–46.0)
HEMOGLOBIN: 13.8 g/dL (ref 12.0–15.0)
MCH: 31.7 pg (ref 26.0–34.0)
MCHC: 33.5 g/dL (ref 30.0–36.0)
MCV: 94.7 fL (ref 78.0–100.0)
Platelets: 300 10*3/uL (ref 150–400)
RBC: 4.35 MIL/uL (ref 3.87–5.11)
RDW: 13 % (ref 11.5–15.5)
WBC: 4.8 10*3/uL (ref 4.0–10.5)

## 2017-12-19 LAB — POCT PREGNANCY, URINE: PREG TEST UR: NEGATIVE

## 2017-12-19 MED ORDER — MEDROXYPROGESTERONE ACETATE 10 MG PO TABS: 10.0000 mg | ORAL_TABLET | Freq: Every day | ORAL | 0 refills | Status: DC

## 2017-12-19 MED ORDER — SULFAMETHOXAZOLE-TRIMETHOPRIM 800-160 MG PO TABS: 1.0000 | ORAL_TABLET | Freq: Two times a day (BID) | ORAL | 0 refills | Status: DC

## 2017-12-19 NOTE — MAU Note (Signed)
Lindsey BrowCarrie R Gray is a 42 y.o. at here in MAU reporting: vaginal bleeding. LMP: irregular bleeding; states has had tubes tied.  Onset of complaint: worse for the last 2 weeks. Problems have actually been ongoing since July 9th with intermittent bleeding.  Pain score: denies Vitals:   12/19/17 1020  BP: (!) 141/72  Pulse: 70  Resp: 18  Temp: 97.8 F (36.6 C)  SpO2: 98%    Lab orders placed from triage: ua and pregnancy test

## 2017-12-19 NOTE — Discharge Instructions (Signed)
The medications you have been prescribed are both on the [facility] $4 list.  Center for [facility]iders          Center for [facility] @ [facility]   Phone: [phone]  Center for [facility] @ [facility]   Phone: [phone]  [facility] @[facility]       Phone: [phone]            Center for [facility] @ [place]     Phone: [phone]          [facility] for [facility] @ [facility]   Phone: [phone]  Family [address] [place]  Phone: [phone]

## 2017-12-19 NOTE — MAU Provider Note (Signed)
History     CSN: [id number]  Arrival date and time: 12/19/17 1010   First Provider Initiated Contact with Patient 12/19/17 1124      Chief Complaint  Patient presents with  . Vaginal Bleeding   HPI  Ms.  [name] is a 42 y.o. year old [phone] non-pregnant female who presents to MAU reporting vaginal bleeding/spotting since 10/05/17. She states the spotting is "not too bad, but enough to aggravate you." She has a h/o BTL in 2016 after the delivery of her last child. She was a patient of [facility], but has not been there since the delivery of her son in 2016. She currently has no health insurance and is seeking a new GYN that accepts uninsured patients.  Past Medical History:  Diagnosis Date  . Bipolar 1 disorder (HCC)   . History of miscarriage   . Hypertension     Past Surgical History:  Procedure Laterality Date  . NO PAST SURGERIES    . TUBAL LIGATION N/A 04/13/2014   Procedure: POST PARTUM TUBAL LIGATION;  Surgeon: [name], MD;  Location: [facility];  Service: Gynecology;  Laterality: N/A;    Family History  Problem Relation Age of Onset  . Cancer Mother   . Cancer Son   . Cancer Maternal Grandmother   . Cancer Maternal Grandfather     Social History   Tobacco Use  . Smoking status: Current Every Day Smoker    Packs/day: 0.50  . Smokeless tobacco: Never Used  Substance Use Topics  . Alcohol use: Yes    Alcohol/week: 24.0 standard drinks    Types: 24 Cans of beer per week  . Drug use: No    Allergies:  Allergies  Allergen Reactions  . Latex Rash  . Penicillins Rash    Has patient had a PCN reaction causing immediate rash, facial/tongue/throat swelling, SOB or lightheadedness with hypotension: Yes  Has patient had a PCN reaction causing severe rash involving mucus membranes or skin necrosis: No Has patient had a PCN reaction that required hospitalization No Has patient had a PCN reaction occurring within the last 10 years: No If all of the above answers  are "NO", then may proceed with Cephalosporin use.     Medications Prior to Admission  Medication Sig Dispense Refill Last Dose  . amitriptyline (ELAVIL) 25 MG tablet Take 25 mg by mouth at bedtime.   12/04/2015 at Unknown time  . aspirin 81 MG chewable tablet Chew 324 mg by mouth once as needed for headache.   12/04/2015 at Unknown time  . erythromycin ophthalmic ointment Place a 1/2 inch ribbon of ointment into the lower eyelid up to four times daily. 3.5 g 0   . fluticasone (FLONASE) 50 MCG/ACT nasal spray Place 2 sprays into both nostrils daily. FOR NASAL CONGESTION 16 g 0   . gabapentin (NEURONTIN) 300 MG capsule Take 300-600 mg by mouth See admin instructions. Take 300 mg every morning and 600 mg every night at bedtime.   12/04/2015 at Unknown time  . guaiFENesin (ROBITUSSIN) 100 MG/5ML liquid Take 5-10 mLs (100-200 mg total) by mouth every 4 (four) hours as needed for cough. FOR CHEST CONGESTION AND PHLEGM 60 mL 0   . HYDROcodone-homatropine (HYCODAN) 5-1.5 MG/5ML syrup Take 5 mLs by mouth every 6 (six) hours as needed for cough. FOR DISRUPTIVE NIGHT TIME COUGH AND BODY ACHES 120 mL 0   . ibuprofen (ADVIL,MOTRIN) 200 MG tablet Take 200 mg by mouth every 6 (six) hours as  needed for mild pain or moderate pain.   12/04/2015 at Unknown time  . omeprazole (PRILOSEC) 20 MG capsule Take 20 mg by mouth daily.   12/04/2015 at Unknown time  . oseltamivir (TAMIFLU) 75 MG capsule Take 1 capsule (75 mg total) by mouth every 12 (twelve) hours. 10 capsule 0   . QUEtiapine (SEROQUEL) 100 MG tablet Take 150 mg by mouth at bedtime.   12/04/2015 at Unknown time    Review of Systems  Constitutional: Negative.   HENT: Negative.   Eyes: Negative.   Respiratory: Negative.   Cardiovascular: Negative.   Gastrointestinal: Negative.   Endocrine: Negative.   Genitourinary: Positive for pelvic pain (cramping) and vaginal bleeding.  Musculoskeletal: Positive for back pain.  Skin: Negative.   Allergic/Immunologic:  Negative.   Neurological: Negative.   Hematological: Negative.   Psychiatric/Behavioral: Negative.    Physical Exam   Blood pressure (!) 141/72, pulse 70, temperature 97.8 F (36.6 C), temperature source Oral, resp. rate 18, weight 59.9 kg, SpO2 98 %.  Physical Exam  Nursing note and vitals reviewed. Constitutional: She is oriented to person, place, and time. She appears well-developed and well-nourished.  HENT:  Head: Normocephalic and atraumatic.  Eyes: Pupils are equal, round, and reactive to light.  Neck: Normal range of motion.  Cardiovascular: Normal rate.  Respiratory: Effort normal.  GI: Soft.  Genitourinary:  Genitourinary Comments: Uterus: non-tender, SE: cervix is smooth, pink, no lesions, small amt of dark, red blood in vaginal vault -- WP, GC/CT done, closed/long/firm, no CMT or friability, no adnexal tenderness   Musculoskeletal: Normal range of motion.  Neurological: She is alert and oriented to person, place, and time. She has normal reflexes.  Skin: Skin is warm and dry.  Psychiatric: She has a normal mood and affect. Her behavior is normal. Judgment and thought content normal.    MAU Course  Procedures  MDM CCUA UPT UCx Wet Prep  GC/CT -- pending  Results for orders placed or performed during the hospital encounter of 12/19/17 (from the past 24 hour(s))  Urinalysis, Routine w reflex microscopic     Status: Abnormal   Collection Time: 12/19/17 10:24 AM  Result Value Ref Range   Color, Urine YELLOW YELLOW   APPearance CLEAR CLEAR   Specific Gravity, Urine 1.011 1.005 - 1.030   pH 5.0 5.0 - 8.0   Glucose, UA NEGATIVE NEGATIVE mg/dL   Hgb urine dipstick LARGE (A) NEGATIVE   Bilirubin Urine NEGATIVE NEGATIVE   Ketones, ur NEGATIVE NEGATIVE mg/dL   Protein, ur NEGATIVE NEGATIVE mg/dL   Nitrite POSITIVE (A) NEGATIVE   Leukocytes, UA NEGATIVE NEGATIVE   RBC / HPF 0-5 0 - 5 RBC/hpf   WBC, UA 6-10 0 - 5 WBC/hpf   Bacteria, UA MANY (A) NONE SEEN    Squamous Epithelial / LPF 0-5 0 - 5  Pregnancy, urine POC     Status: None   Collection Time: 12/19/17 10:26 AM  Result Value Ref Range   Preg Test, Ur NEGATIVE NEGATIVE  Wet prep, genital     Status: Abnormal   Collection Time: 12/19/17 11:38 AM  Result Value Ref Range   Yeast Wet Prep HPF POC NONE SEEN NONE SEEN   Trich, Wet Prep NONE SEEN NONE SEEN   Clue Cells Wet Prep HPF POC NONE SEEN NONE SEEN   WBC, Wet Prep HPF POC FEW (A) NONE SEEN   Sperm NONE SEEN   CBC     Status: None   Collection Time: 12/19/17  12:04 PM  Result Value Ref Range   WBC 4.8 4.0 - 10.5 K/uL   RBC 4.35 3.87 - 5.11 MIL/uL   Hemoglobin 13.8 12.0 - 15.0 g/dL   HCT 91.441.2 78.236.0 - 95.646.0 %   MCV 94.7 78.0 - 100.0 fL   MCH 31.7 26.0 - 34.0 pg   MCHC 33.5 30.0 - 36.0 g/dL   RDW 21.313.0 08.611.5 - 57.815.5 %   Platelets 300 150 - 400 K/uL    Assessment and Plan  Abnormal uterine bleeding (AUB) - Rx for Provera 10 mg x 10 days - Information provided on AUB   Acute cystitis with hematuria  - Rx for Bactrim DS BID x 3 days - Information provided on UTI  - Discharge patient - [facility] - Advised to [facility] an [facility] - Patient verbalized an understanding of the plan of care and agrees.     Raelyn Moraolitta Kore Madlock, MSN, CNM 12/19/2017, 11:25 AM

## 2017-12-20 LAB — GC/CHLAMYDIA PROBE AMP (~~LOC~~) NOT AT ARMC
Chlamydia: NEGATIVE
Neisseria Gonorrhea: NEGATIVE

## 2017-12-21 LAB — URINE CULTURE: SPECIAL REQUESTS: NORMAL

## 2018-01-05 ENCOUNTER — Other Ambulatory Visit: Payer: Self-pay

## 2018-01-05 ENCOUNTER — Encounter (HOSPITAL_COMMUNITY): Payer: Self-pay | Admitting: Emergency Medicine

## 2018-01-05 ENCOUNTER — Emergency Department (HOSPITAL_COMMUNITY)
Admission: EM | Admit: 2018-01-05 | Discharge: 2018-01-05 | Discharge status: Against Medical Advice | Payer: Self-pay | Attending: Emergency Medicine | Admitting: Emergency Medicine

## 2018-01-05 DIAGNOSIS — N939 Abnormal uterine and vaginal bleeding, unspecified: Secondary | ICD-10-CM | POA: Insufficient documentation

## 2018-01-05 DIAGNOSIS — Z5321 Procedure and treatment not carried out due to patient leaving prior to being seen by health care provider: Secondary | ICD-10-CM | POA: Insufficient documentation

## 2018-01-05 NOTE — ED Notes (Signed)
Pt called x 2 with no answer  

## 2018-01-05 NOTE — ED Triage Notes (Addendum)
Pt verbalizes ongoing vaginal bleeding since May; given progesterone for such; ran out of progesterone medication so bleeding started back in August.

## 2018-01-05 NOTE — ED Notes (Signed)
Pt called x1 with no answer.  

## 2018-01-24 ENCOUNTER — Encounter: Payer: Self-pay | Admitting: Family Medicine

## 2018-01-24 ENCOUNTER — Other Ambulatory Visit (HOSPITAL_COMMUNITY)
Admission: RE | Admit: 2018-01-24 | Discharge: 2018-01-24 | Disposition: A | Discharge status: Home / Self Care | Payer: Self-pay | Source: Ambulatory Visit | Attending: Family Medicine | Admitting: Family Medicine

## 2018-01-24 ENCOUNTER — Ambulatory Visit: Payer: Self-pay | Admitting: Family Medicine

## 2018-01-24 VITALS — BP 136/100 | HR 78 | Ht 66.0 in | Wt 133.7 lb

## 2018-01-24 DIAGNOSIS — Z1239 Encounter for other screening for malignant neoplasm of breast: Secondary | ICD-10-CM

## 2018-01-24 DIAGNOSIS — N939 Abnormal uterine and vaginal bleeding, unspecified: Secondary | ICD-10-CM

## 2018-01-24 DIAGNOSIS — I10 Essential (primary) hypertension: Secondary | ICD-10-CM

## 2018-01-24 NOTE — Progress Notes (Signed)
Subjective:    [name] - 42 y.o. female MRN [id number]  Date of birth: 26-May-1975  HPI  [name] is a 42 y.o. [phone] female here for heavy bleeding. She states that she had what she thought was a normal period in July that lasted about a week. After that she had a week without bleeding. Then her period started again and continued for about 2 months until it stopped just about a week or two ago. Her period was not heavy the entire time. It was intermittently heavy, then light, then moderate on and off during that time. She has not had symptoms of anemia. She reports a strong family history of breast and other cancers.  No known history of uterine or endometrial cancer. No previous endometrial biopsy.    OB History    Gravida  7   Para  3   Term  0   Preterm  1   AB  4   Living  3     SAB  3   TAB      Ectopic  1   Multiple  0   Live Births  3             Health Maintenance:  Last pap on file 07/11/2007 LGSIL w positive HRHPV. Normal mammogram on 08/03/2007.  Health Maintenance:  Health Maintenance Due  Topic Date Due  . PAP SMEAR  04/12/1996  . INFLUENZA VACCINE  10/28/2017    -  reports that she has been smoking. She has been smoking about 0.50 packs per day. She has never used smokeless tobacco. - Review of Systems: Per HPI. - Past Medical History: Patient Active Problem List   Diagnosis Date Noted  . Abnormal uterine bleeding (AUB) 12/19/2017  . UTI (urinary tract infection) 12/19/2017  . SVD (spontaneous vaginal delivery) 04/12/2014  . Laceration of periurethral tissue with delivery 04/12/2014  . IUGR (intrauterine growth restriction) 04/11/2014  . Oligohydramnios in third trimester 04/11/2014  . Latex allergy 04/11/2014  . Allergy to penicillin 04/11/2014  . Alcohol use affecting pregnancy 04/11/2014  . Substance abuse--marijuana use early pregnancy 04/11/2014  . Hx of pre-eclampsia in prior pregnancy, currently pregnant--2nd pregnancy  04/11/2014  . Insufficient weight gain during pregnancy 04/11/2014  . Bipolar 1 disorder (HCC) 10/02/2013  . HTN (hypertension)--no recent/current meds. 10/02/2013  . Smoker 10/02/2013   - Medications: reviewed and updated   Objective:   Physical Exam BP (!) 136/100   Pulse 78   Ht 5\' 6"  (1.676 m)   Wt 133 lb 11.2 oz (60.6 kg)   BMI 21.58 kg/m  Gen: NAD, alert, cooperative with exam, well-appearing HEENT: NCAT, PER, clear conjunctiva, oropharynx clear, supple neck CV: RRR Resp: non-labored Skin: no rashes, normal turgor  Neuro: no gross deficits.  Psych: good insight, alert and oriented GU/GYN: Exam performed in the presence of a chaperone. External genitalia within normal limits.  Vaginal mucosa pink, moist, normal rugae.  Nonfriable cervix without lesions, no discharge or bleeding noted on speculum exam.            Assessment & Plan:   1. Abnormal uterine bleeding - Endometrial biopsy today   2. Hypertension, unspecified type - Ambulatory referral to Internal Medicine  3. Screening for malignant neoplasm of breast - Refer to scholarship fund for payor source since pt uninsured   Routine preventative health maintenance measures emphasized. Please refer to After Visit Summary for other counseling recommendations.   No follow-ups on file.  Gwenevere Abbot, MD OB Fellow  01/24/2018, 4:15 PM

## 2018-01-24 NOTE — Progress Notes (Signed)
Had Pt to fill out Mammography Scholarship Form. They will contact her.

## 2018-01-24 NOTE — Progress Notes (Signed)
Pt states bleeding stopped 4 days ago but prior to that she bleed for 2 months straight.

## 2018-01-26 ENCOUNTER — Encounter: Payer: Self-pay | Admitting: *Deleted

## 2018-01-27 ENCOUNTER — Telehealth: Payer: Self-pay | Admitting: *Deleted

## 2018-01-27 NOTE — Telephone Encounter (Signed)
Danity called and left a voicemessage at 4:05pm stating she is calling about results because she hasn't heard anything.

## 2018-01-28 NOTE — Telephone Encounter (Signed)
I called [name] back and left a message we are returning your call and since you have MyChart I will send you back a message there. If you still have questions you may message [place] or call us back.

## 2018-02-03 ENCOUNTER — Telehealth: Payer: Self-pay | Admitting: *Deleted

## 2018-02-03 NOTE — Telephone Encounter (Signed)
-----   Message from Gwenevere Abbot, MD sent at 02/01/2018  2:05 PM EST ----- Regarding: RE: follow up Can someone call her and let her know that her results are normal? The test is not perfect, and can miss some people with early cancer, so if she continues to have bleeding, she can follow up for more studies. Thanks!  ----- Message ----- From: Gerome Apley, RN Sent: 01/31/2018   3:36 PM EST To: Gwenevere Abbot, MD Subject: follow up                                      Dr. Aneta Mins, Now that her endo bx is back, she wants to know what her follow up is. Can you send message to clinical pool if she needs an appt, or message you want [place] to tell her? Yenny Kosa,RN

## 2018-02-03 NOTE — Telephone Encounter (Signed)
Called patient per Dr. Aneta Mins request and notified her results and recommendation. [name] now states no bleeding but having intermittent sharp pain at times 0-7.  Was going to go to hospital in [place] tomorrow- states is not severe . I advised her she can go to wherever she likes for care but I would reccommnend here since we have already seen her.  I advised her I will send message to registrars to call her with appointment within next 2 weeks ; but if her pain becomes severe or she feels she needs to be seen sooner to come to [place]. She voices understanding.

## 2018-02-16 ENCOUNTER — Ambulatory Visit (INDEPENDENT_AMBULATORY_CARE_PROVIDER_SITE_OTHER): Payer: Self-pay | Admitting: Family Medicine

## 2018-02-16 ENCOUNTER — Encounter: Payer: Self-pay | Admitting: Family Medicine

## 2018-02-16 VITALS — BP 127/77 | HR 68 | Wt 135.0 lb

## 2018-02-16 DIAGNOSIS — Z803 Family history of malignant neoplasm of breast: Secondary | ICD-10-CM

## 2018-02-16 DIAGNOSIS — N939 Abnormal uterine and vaginal bleeding, unspecified: Secondary | ICD-10-CM

## 2018-02-16 DIAGNOSIS — F319 Bipolar disorder, unspecified: Secondary | ICD-10-CM

## 2018-02-16 DIAGNOSIS — Z7289 Other problems related to lifestyle: Secondary | ICD-10-CM

## 2018-02-16 DIAGNOSIS — Z789 Other specified health status: Secondary | ICD-10-CM

## 2018-02-16 MED ORDER — SERTRALINE HCL 50 MG PO TABS: 50.0000 mg | ORAL_TABLET | Freq: Every day | ORAL | Status: DC

## 2018-02-16 MED ORDER — ARIPIPRAZOLE 15 MG PO TABS: 15.0000 mg | ORAL_TABLET | Freq: Every day | ORAL | Status: DC

## 2018-02-16 NOTE — Patient Instructions (Signed)
Pelvic Pain, Female °Pelvic pain is pain in your lower belly (abdomen), below your belly button and between your hips. The pain may start suddenly (acute), keep coming back (recurring), or last a long time (chronic). Pelvic pain that lasts longer than six months is considered chronic. There are many causes of pelvic pain. Sometimes the cause of your pelvic pain is not known. °Follow these instructions at home: °· Take over-the-counter and prescription medicines only as told by your doctor. °· Rest as told by your doctor. °· Do not have sex it if hurts. °· Keep a journal of your pelvic pain. Write down: °? When the pain started. °? Where the pain is located. °? What seems to make the pain better or worse, such as food or your menstrual cycle. °? Any symptoms you have along with the pain. °· Keep all follow-up visits as told by your doctor. This is important. °Contact a doctor if: °· Medicine does not help your pain. °· Your pain comes back. °· You have new symptoms. °· You have unusual vaginal discharge or bleeding. °· You have a fever or chills. °· You are having a hard time pooping (constipation). °· You have blood in your pee (urine) or poop (stool). °· Your pee smells bad. °· You feel weak or lightheaded. °Get help right away if: °· You have sudden pain that is very bad. °· Your pain continues to get worse. °· You have very bad pain and also have any of the following symptoms: °? A fever. °? Feeling stick to your stomach (nausea). °? Throwing up (vomiting). °? Being very sweaty. °· You pass out (lose consciousness). °This information is not intended to replace advice given to you by your health care provider. Make sure you discuss any questions you have with your health care provider. °Document Released: 09/02/2007 Document Revised: 04/10/2015 Document Reviewed: 01/04/2015 °Elsevier Interactive Patient Education © 2018 [employer] ° °

## 2018-02-16 NOTE — Progress Notes (Signed)
Pt reports pelvic pain 6/10. Pt states she has been bleeding for over a week. Pt reports using 1 pad a day.

## 2018-02-16 NOTE — Progress Notes (Signed)
   Subjective:    Patient ID: Shanon BrowCarrie R Ellingson is a 42 y.o. female presenting with Vaginal Bleeding  on 02/16/2018  HPI: Cycles had been regular prior to July. In July had 2 cycles. In August bled x 4 wks. Given provera which stopped her bleeding. She followed up here and had an EMB and that was negative. She has had more pain. Had pain in lower abdomen on 10/31. Heating pad helped. There is a stabbing pain on right side, that comes and goes, and is worse with movement. It is not cyclical. Pain has been there for a long time. Mom went through menopause early.  Review of Systems  Constitutional: Negative for chills and fever.  Respiratory: Negative for shortness of breath.   Cardiovascular: Negative for chest pain.  Gastrointestinal: Negative for abdominal pain, nausea and vomiting.  Genitourinary: Positive for pelvic pain and vaginal bleeding. Negative for dysuria.  Skin: Negative for rash.      Objective:    BP 127/77   Pulse 68   Wt 135 lb (61.2 kg)   LMP 02/06/2018 (Exact Date)   BMI 21.79 kg/m  Physical Exam  Constitutional: She is oriented to person, place, and time. She appears well-developed and well-nourished. No distress.  HENT:  Head: Normocephalic and atraumatic.  Eyes: No scleral icterus.  Neck: Neck supple.  Cardiovascular: Normal rate.  Pulmonary/Chest: Effort normal.  Abdominal: Soft.  Neurological: She is alert and oriented to person, place, and time.  Skin: Skin is warm and dry.  Psychiatric: She has a normal mood and affect.        Assessment & Plan:   Problem List Items Addressed This Visit      Unprioritized   Bipolar 1 disorder (HCC)   Relevant Medications   sertraline (ZOLOFT) 50 MG tablet   ARIPiprazole (ABILIFY) 15 MG tablet   Alcohol use    Has felt the ned to cut down, but drinks a lot less than before--check LFT's and PT/INR to ensure liver working ok.      Relevant Orders   Comprehensive metabolic panel   Protime-INR   Abnormal  uterine bleeding (AUB) - Primary    Check pelvic sono--treatment options following this      Relevant Orders   Follicle stimulating hormone   TSH   US PELVIC COMPLETE WITH TRANSVAGINAL    Other Visit Diagnoses    Family history of breast cancer       Relevant Orders   MM DIGITAL SCREENING BILATERAL      Total face-to-face time with patient: 25 minutes. Over 50% of encounter was spent on counseling and coordination of care. Return in about 4 weeks (around 03/16/2018).  Reva Boresanya S Jean Alejos 02/16/2018 2:52 PM

## 2018-02-17 ENCOUNTER — Encounter: Payer: Self-pay | Admitting: Family Medicine

## 2018-02-17 ENCOUNTER — Ambulatory Visit: Payer: Self-pay | Attending: Family Medicine | Admitting: Family Medicine

## 2018-02-17 VITALS — BP 131/85 | HR 79 | Temp 98.1°F | Ht 66.0 in | Wt 134.0 lb

## 2018-02-17 DIAGNOSIS — Z880 Allergy status to penicillin: Secondary | ICD-10-CM | POA: Insufficient documentation

## 2018-02-17 DIAGNOSIS — Z8249 Family history of ischemic heart disease and other diseases of the circulatory system: Secondary | ICD-10-CM | POA: Insufficient documentation

## 2018-02-17 DIAGNOSIS — Z803 Family history of malignant neoplasm of breast: Secondary | ICD-10-CM | POA: Insufficient documentation

## 2018-02-17 DIAGNOSIS — Z79899 Other long term (current) drug therapy: Secondary | ICD-10-CM | POA: Insufficient documentation

## 2018-02-17 DIAGNOSIS — J0100 Acute maxillary sinusitis, unspecified: Secondary | ICD-10-CM | POA: Insufficient documentation

## 2018-02-17 DIAGNOSIS — I10 Essential (primary) hypertension: Secondary | ICD-10-CM | POA: Insufficient documentation

## 2018-02-17 DIAGNOSIS — J0110 Acute frontal sinusitis, unspecified: Secondary | ICD-10-CM

## 2018-02-17 DIAGNOSIS — F319 Bipolar disorder, unspecified: Secondary | ICD-10-CM | POA: Insufficient documentation

## 2018-02-17 DIAGNOSIS — Z91040 Latex allergy status: Secondary | ICD-10-CM | POA: Insufficient documentation

## 2018-02-17 DIAGNOSIS — F17210 Nicotine dependence, cigarettes, uncomplicated: Secondary | ICD-10-CM

## 2018-02-17 LAB — COMPREHENSIVE METABOLIC PANEL
ALK PHOS: 32 IU/L — AB (ref 39–117)
ALT: 8 IU/L (ref 0–32)
AST: 12 IU/L (ref 0–40)
Albumin/Globulin Ratio: 1.6 (ref 1.2–2.2)
Albumin: 4.2 g/dL (ref 3.5–5.5)
BUN/Creatinine Ratio: 19 (ref 9–23)
BUN: 15 mg/dL (ref 6–24)
Bilirubin Total: <0.2 mg/dL (ref 0.0–1.2)
CO2: 22 mmol/L (ref 20–29)
CREATININE: 0.77 mg/dL (ref 0.57–1.00)
Calcium: 9 mg/dL (ref 8.7–10.2)
Chloride: 103 mmol/L (ref 96–106)
GFR calc Af Amer: 110 mL/min/{1.73_m2} (ref >59)
GFR calc non Af Amer: 96 mL/min/{1.73_m2} (ref >59)
GLUCOSE: 79 mg/dL (ref 65–99)
Globulin, Total: 2.7 g/dL (ref 1.5–4.5)
Potassium: 4.4 mmol/L (ref 3.5–5.2)
Sodium: 139 mmol/L (ref 134–144)
Total Protein: 6.9 g/dL (ref 6.0–8.5)

## 2018-02-17 LAB — PROTIME-INR
INR: 0.9 (ref 0.8–1.2)
PROTHROMBIN TIME: 10 s (ref 9.1–12.0)

## 2018-02-17 LAB — TSH: TSH: 1.37 u[IU]/mL (ref 0.450–4.500)

## 2018-02-17 LAB — FOLLICLE STIMULATING HORMONE: FSH: 47.6 m[IU]/mL

## 2018-02-17 MED ORDER — AZITHROMYCIN 250 MG PO TABS: ORAL_TABLET | ORAL | 0 refills | Status: DC

## 2018-02-17 NOTE — Assessment & Plan Note (Signed)
Check pelvic sono--treatment options following this

## 2018-02-17 NOTE — Assessment & Plan Note (Signed)
Has felt the ned to cut down, but drinks a lot less than before--check LFT's and PT/INR to ensure liver working ok.

## 2018-02-17 NOTE — Patient Instructions (Signed)

## 2018-02-17 NOTE — Progress Notes (Signed)
Pt. Is here to establish care for hypertension.   Pt. Stated she was previously on BP medication Hydrochlorothiazide.

## 2018-02-17 NOTE — Progress Notes (Signed)
Subjective:    Patient ID: [name], female    DOB: 1975-06-09, 42 y.o.   MRN: [id number]  HPI       42 year old female new to the practice.  Patient with complaint of frontal and temporal headache as well as nasal congestion, facial pressure and postnasal drainage for over 2 weeks.  Patient states that she has a history of hypertension for which she was on hydrochlorothiazide in the past.  Patient presents to have her blood pressure checked to see if she needs to be back on blood pressure medication for hypertension.  Patient reports that she [facility] and has appointments every 3 months.  Patient states that her last appointment the lower number of her blood pressure was elevated.  [facility].  Patient denies any dizziness, just the dull headaches.  Patient does not believe that she has had any fever or chills.  Patient has had no chest pain or palpitations, no shortness of breath.  Patient does smoke approximately half pack per day of tobacco and is interested in quitting.  Patient has tried Chantix as well as nicotine patches in the past.       Patient reports an allergy to penicillin which causes hives and a contact rash with latex exposure.  Patient has a family history of maternal grandmother and mother having breast cancer.  Patient states that she recently saw OB/GYN who scheduled her for her mammogram.  Patient reports that her father had a history of hypertension and atrial fibrillation.  Patient reports a social history of being divorced and current tobacco use of 1/2 pack/day of cigarettes and patient drinks approximately 3 beers per day.  Patient reports past surgical history of tubal ligation.  Past Medical History:  Diagnosis Date  . Bipolar 1 disorder (HCC)   . History of miscarriage   . Hypertension   . Past Surgical History:  Procedure Laterality Date  . NO PAST SURGERIES    . TUBAL LIGATION N/A 04/13/2014   Procedure:  POST PARTUM TUBAL LIGATION;  Surgeon: Jaymes GraffNaima Dillard, MD;  Location: [facility];  Service: Gynecology;  Laterality: N/A;   Family History  Problem Relation Age of Onset  . Cancer Mother 2856       breast  . Cancer Maternal Grandmother 50       breast  . Cancer Maternal Grandfather        stomach  . Hypertension Father   . Heart disease Father    Social History   Tobacco Use  . Smoking status: Current Every Day Smoker    Packs/day: 0.50  . Smokeless tobacco: Never Used  Substance Use Topics  . Alcohol use: Yes    Alcohol/week: 24.0 standard drinks    Types: 24 Cans of beer per week  . Drug use: Yes    Types: Marijuana   Allergies  Allergen Reactions  . Latex Rash  . Penicillins Rash    Has patient had a PCN reaction causing immediate rash, facial/tongue/throat swelling, SOB or lightheadedness with hypotension: Yes  Has patient had a PCN reaction causing severe rash involving mucus membranes or skin necrosis: No Has patient had a PCN reaction that required hospitalization No Has patient had a PCN reaction occurring within the last 10 years: No If all of the above answers are "NO", then may proceed with Cephalosporin use.    Current Outpatient Medications:  .  amitriptyline (ELAVIL) 25 MG tablet, Take 25 mg by mouth  at bedtime., Disp: , Rfl:  .  ARIPiprazole (ABILIFY) 15 MG tablet, Take 1 tablet (15 mg total) by mouth daily., Disp: , Rfl:  .  gabapentin (NEURONTIN) 300 MG capsule, Take 300-600 mg by mouth See admin instructions. Take 300 mg every morning and 600 mg every night at bedtime., Disp: , Rfl:  .  ibuprofen (ADVIL,MOTRIN) 200 MG tablet, Take 200 mg by mouth every 6 (six) hours as needed for mild pain or moderate pain., Disp: , Rfl:  .  omeprazole (PRILOSEC) 20 MG capsule, Take 20 mg by mouth daily., Disp: , Rfl:  .  sertraline (ZOLOFT) 50 MG tablet, Take 1 tablet (50 mg total) by mouth daily., Disp: , Rfl:  .  azithromycin (ZITHROMAX) 250 MG tablet, Take 2 pills the first  day then 1 pill daily for 4 days, Disp: 6 tablet, Rfl: 0 .  medroxyPROGESTERone (PROVERA) 10 MG tablet, Take 1 tablet (10 mg total) by mouth daily for 10 days., Disp: 10 tablet, Rfl: 0    Review of Systems  Constitutional: Positive for fatigue. Negative for chills, diaphoresis and fever.  HENT: Positive for congestion, rhinorrhea and sinus pressure. Negative for ear pain, nosebleeds, postnasal drip, sinus pain, sore throat and trouble swallowing.   Respiratory: Positive for cough. Negative for shortness of breath.   Cardiovascular: Negative for chest pain, palpitations and leg swelling.  Gastrointestinal: Negative for abdominal pain and nausea.  Endocrine: Negative for polydipsia, polyphagia and polyuria.  Genitourinary: Negative for dysuria and frequency.  Musculoskeletal: Negative for back pain and gait problem.  Neurological: Positive for headaches. Negative for dizziness and numbness.       Objective:   Physical Exam BP 131/85 (BP Location: Right Arm, Patient Position: Sitting, Cuff Size: Normal)   Pulse 79   Temp 98.1 F (36.7 C) (Oral)   Ht 5\' 6"  (1.676 m)   Wt 134 lb (60.8 kg)   LMP 02/13/2018   SpO2 98%   BMI 21.63 kg/m Nurse's notes and vital signs reviewed General-well-nourished, well-developed female in no acute distress ENT- right TM is dull, left TM is light pink and slightly retracted, nares with edema and erythema of the nasal turbinates/mucosa and patient with left-sided yellow nasal discharge.  Patient with tenderness over the frontal and left maxillary sinuses Patient with posterior pharynx erythema Neck-supple, patient with bilateral anterior cervical chain lymphadenopathy which is nontender but she does have enlargement of these lymph nodes Lungs-clear to auscultation bilaterally with mild decreased breath sounds at the lung bases Cardiovascular-regular rate rhythm Abdomen-soft, nontender Extremities-no edema Back-no CVA tenderness       Assessment &  Plan:  1. Acute frontal sinusitis, recurrence not specified Patient with acute frontal/maxillary sinusitis.  Prescription sent to patient's pharmacy for azithromycin.  On review of chart, patient had her blood pressure done yesterday at her OB/GYN office and her blood pressure at that time was 127/77 and blood pressure today is 131/85.  Patient's headaches may be related to her current sinus infection and patient also reports that she does not sleep well and I discussed with the patient that fatigue/poor sleep can also cause issues with headaches.  Patient has been asked to keep a blood pressure diary and to have [place] fax her for most recent blood pressure readings when she goes there tomorrow to be seen.  Patient did sign medical record release at today's visit. - azithromycin (ZITHROMAX) 250 MG tablet; Take 2 pills the first day then 1 pill daily for 4 days  Dispense: 6  tablet; Refill: 0  2. Tobacco dependence due to cigarettes Patient with tobacco dependence and patient states that she is interested in smoking cessation.  Patient will return to clinic in 2 to 3 weeks to meet with the clinical pharmacist to discuss smoking cessation.  Patient can also have blood pressure rechecked at that time.  *Influenza immunization was not offered as patient with current illness but patient may have immunization at upcoming pharmacy visit or at flu shot clinic  An After Visit Summary was printed and given to the patient.  Return in about 4 months (around 06/18/2018) for 2-3 week f/u with CPP Franky Macho); Marland Kitchen

## 2018-02-21 ENCOUNTER — Ambulatory Visit (HOSPITAL_COMMUNITY)
Admission: RE | Admit: 2018-02-21 | Discharge: 2018-02-21 | Disposition: A | Discharge status: Home / Self Care | Payer: Self-pay | Source: Ambulatory Visit | Attending: Family Medicine | Admitting: Family Medicine

## 2018-02-21 DIAGNOSIS — N939 Abnormal uterine and vaginal bleeding, unspecified: Secondary | ICD-10-CM | POA: Insufficient documentation

## 2018-03-03 ENCOUNTER — Ambulatory Visit: Payer: Self-pay | Attending: Family Medicine | Admitting: Pharmacist

## 2018-03-03 ENCOUNTER — Encounter: Payer: Self-pay | Admitting: Pharmacist

## 2018-03-03 DIAGNOSIS — F17210 Nicotine dependence, cigarettes, uncomplicated: Secondary | ICD-10-CM | POA: Insufficient documentation

## 2018-03-03 MED ORDER — NICOTINE 21 MG/24HR TD PT24: 21.0000 mg | MEDICATED_PATCH | Freq: Every day | TRANSDERMAL | 1 refills | Status: DC

## 2018-03-03 NOTE — Patient Instructions (Signed)
Thanks for coming to see me today!   Please start the Step 1 nicotine patches. Use 1 patch daily. Remove the old patch before placing a new one.   You can use the 4 mg gum or lozenge as needed for breakthrough cravings.   If you use the gum, chew 1 piece until tasting a peppery taste. Then park this in your cheek. You can use 1 piece every 1-2 hours with the max of 24 a day.  If you use the lozenge, place this in your cheek and let dissolve. You can use 1 lozenge every 1-2 hours with a max of 20 a day.   You can do it! I'm here for you if you need me!   My office number is [phone].

## 2018-03-03 NOTE — Progress Notes (Signed)
S:  Patient was reviewed by clinical pharmacist for assistance with tobacco cessation.   Tobacco Use History  Age when started using tobacco on a daily basis 16.  Type: cigarettes.  Number of cigarettes per day 10-15, brand [facility].  Estimated nicotine content: ~12-15 mg per day.   Smokes first cigarette <5 minutes after waking.  Does wake at night to smoke  Fagerstrom Score 6/10.  Triggers include emotional: anxiety.  Quit Attempt History   Most recent quit attempt ~6 months ago.  Longest time ever been tobacco free 9 months with Chantix.  Methods tried in the past include Varenicline (Chantix) and NRT via patch.  Rates IMPORTANCE of quitting tobacco on 1-10 scale of 10.  Rates READINESS of quitting tobacco on 1-10 scale of 10.  Rates CONFIDENCE of quitting tobacco on 1-10 scale of 6-7.  Motivators to quitting include health, family members, 3 YO son; barriers include under a lot of stress now   A/P: Nicotine dependence: moderate , 26 years duration in a patient who is good candidate for success b/c of previous success. She reveals that she was tobacco-free with Chantix for ~9 months but began smoking because "my therapist made me mad".   She reports good success with the patch but they became too expensive to obtain.  Reviewed options and patient reports interest in NRT via patch/gum. She can get those for a reduced price here at our pharmacy. Initiated Nicotine Patch. She has been instructed to use gum or lozenge for breakthrough cravings. I recommend the 4 mg as she smokes < 30 minutes upon awakening. She smokes at least 10 cigarettes a day. I recommend to start with Step 1 or 21 mg/day of the nicotine patch for 6 weeks. Treatment was reviewed with the patient, including name, instructions, goals of therapy, potential side effects, importance of adherence, and safe use.  Reviewed potential challenges and coping skills/strategies with patient. Provided information on 1  800-QUIT NOW support program and advised patient to contact me if questions/concerns arise. Patient verbalized understanding of information by repeating back.  Follow up in 1.5 month(s)  [name] 2:12 PM 03/03/2018

## 2018-03-04 MED FILL — NICOTINE 21 MG/24HR PATCH: 21 | 28 days supply | Qty: 28 | Fill #0

## 2018-03-16 ENCOUNTER — Encounter: Payer: Self-pay | Admitting: Family Medicine

## 2018-03-16 ENCOUNTER — Ambulatory Visit: Payer: Self-pay | Admitting: Family Medicine

## 2018-03-16 NOTE — Progress Notes (Signed)
Patient did not keep appointment today. She may call to reschedule.  

## 2018-04-15 ENCOUNTER — Ambulatory Visit: Payer: Self-pay | Admitting: Pharmacist

## 2018-06-20 ENCOUNTER — Ambulatory Visit: Payer: Self-pay | Admitting: Family Medicine

## 2019-09-01 ENCOUNTER — Ambulatory Visit (INDEPENDENT_AMBULATORY_CARE_PROVIDER_SITE_OTHER): Payer: No Payment, Other | Admitting: Licensed Clinical Social Worker

## 2019-09-01 ENCOUNTER — Other Ambulatory Visit: Payer: Self-pay

## 2019-09-01 DIAGNOSIS — F418 Other specified anxiety disorders: Secondary | ICD-10-CM | POA: Diagnosis not present

## 2019-09-04 ENCOUNTER — Telehealth (INDEPENDENT_AMBULATORY_CARE_PROVIDER_SITE_OTHER): Payer: No Payment, Other | Admitting: Psychiatry

## 2019-09-04 ENCOUNTER — Other Ambulatory Visit: Payer: Self-pay

## 2019-09-04 ENCOUNTER — Encounter (HOSPITAL_COMMUNITY): Payer: Self-pay | Admitting: Psychiatry

## 2019-09-04 DIAGNOSIS — F319 Bipolar disorder, unspecified: Secondary | ICD-10-CM

## 2019-09-04 MED ORDER — GABAPENTIN 300 MG PO CAPS: 300.0000 mg | ORAL_CAPSULE | ORAL | 1 refills | Status: DC

## 2019-09-04 MED ORDER — OLANZAPINE 5 MG PO TABS: 5.0000 mg | ORAL_TABLET | Freq: Every day | ORAL | 2 refills | Status: DC

## 2019-09-04 MED ORDER — SERTRALINE HCL 50 MG PO TABS: 50.0000 mg | ORAL_TABLET | Freq: Every day | ORAL | Status: DC

## 2019-09-04 MED ORDER — SERTRALINE HCL 50 MG PO TABS: 50.0000 mg | ORAL_TABLET | Freq: Every day | ORAL | 1 refills | Status: DC
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
## 2019-09-04 NOTE — Addendum Note (Signed)
Addended by: Shanna Cisco on: 09/04/2019 02:39 PM   Modules accepted: Orders

## 2019-09-05 ENCOUNTER — Telehealth (HOSPITAL_COMMUNITY): Payer: Self-pay | Admitting: Psychiatry

## 2019-09-08 ENCOUNTER — Other Ambulatory Visit: Payer: Self-pay

## 2019-09-08 ENCOUNTER — Ambulatory Visit (HOSPITAL_COMMUNITY): Payer: Self-pay | Admitting: Licensed Clinical Social Worker

## 2019-09-08 DIAGNOSIS — F418 Other specified anxiety disorders: Secondary | ICD-10-CM

## 2019-09-08 NOTE — Progress Notes (Signed)
   THERAPIST PROGRESS NOTE  Session Time: 45 min  Participation Level: Active  Behavioral Response: Well GroomedAlertAnxious  Type of Therapy: Individual Therapy  Treatment Goals addressed: Anxiety and Coping  Interventions: Supportive and Other: Guided imagery and deep breathing.  Summary: [name] is a 44 y.o. female who presents with ongoing anx/dep symptoms. She reports having to attend two funerals in the past wk. One was for the mother of a childhood friend and the other was for a friend who committed suicide. LCSW provided active and supportive listening with some education r/t normal grieving process. Went on to address pt's goal of increased control with parenting her 105 yr old son. Pt believes he may have MH issues himself. Discussed having him evaluated. She will call pediatrician to discuss. She reports two episodes of starting to lose patience with child over the past wk. She states she did implement deep breathing instructed by LCSW on eval and it helped some. LCSW praised behavior and provided additional education on deep breathing. Went on to provide education on [employer]. Pt verbalizes understanding and practices with LCSW. States she believes this will be helpful. Further discussed having an age appropriate brief discussion with child about her love for him and her efforts to get help to be a better parent. Pt agrees with all planning. Wishes to continue video visits d/t transportation issues. She was able to get her camera activated so LCSW could see her this session.    Suicidal/Homicidal: Nowithout intent/plan  Therapist Response: Pt continues to receptive to care and motivated to change.  Plan: Return again in ~ one week.  Diagnosis: Axis I: Anxiety Associated with Depression    Axis II: Deferred  [place] Sink, LCSW 09/08/2019

## 2019-09-15 ENCOUNTER — Ambulatory Visit (INDEPENDENT_AMBULATORY_CARE_PROVIDER_SITE_OTHER): Payer: No Payment, Other | Admitting: Licensed Clinical Social Worker

## 2019-09-15 ENCOUNTER — Other Ambulatory Visit: Payer: Self-pay

## 2019-09-15 DIAGNOSIS — F418 Other specified anxiety disorders: Secondary | ICD-10-CM | POA: Diagnosis not present

## 2019-09-15 NOTE — Progress Notes (Signed)
   THERAPIST PROGRESS NOTE  Session Time: 40 min  Participation Level: Active  Behavioral Response: Well GroomedAlertEuthymic  Type of Therapy: Individual Therapy  Treatment Goals addressed: Coping  Interventions: Solution Focused and Supportive  Summary: [name] is a 44 y.o. female who presents with hx of dep/anx. Pt states "I have had a good week." She reports the past wk she has been "much more calm" and "not raising voice" r/t communication and supervision of son [name]. She also states her son has been behaving better over past wk. She has not yet had an age appropriate conversation with son about her goal of improved parenting skills and reduced irritability but states she does intend to do so. She feels the right time did not present over the past wk. Pt decided to wait until her son starts Kindergarten in August to address her concerns he may have some MH issues that need to be eval. LCSW provided support for plan. Pt has continually used her deep breathing techniques to regulate her mood and is now incorporating guided imagery as reviewed last session. Pt states this has been a very helpful addition to her coping skills. She states "I imagined being at the beach so many times this past week it felt like I was there". LCSW praised her openness to trying technique and then using as needed. Pt has mainly had feelings of anxiety r/t work stress. Assisted to process these feelings/experiences. Assessed for coping with recent losses. Pt believes she is mostly coping adequately. Admits she is struggling more with the friend who committed suicide. LCSW normalized her feelings and provided additional grief education. Reviewed poc prior to close of session with pt's verbal agreement. She will move to q other wk and remain with video sessions at this time. Pt states appreciation for care.    Suicidal/Homicidal: Nowithout intent/plan  Therapist Response: Pt remains open and receptive to  care.  Plan: Return again in 2 weeks.  Diagnosis: Axis I: Anxiety Associated with Depression    Axis II: Deferred    [place] Sink, LCSW 09/15/2019

## 2019-09-29 ENCOUNTER — Other Ambulatory Visit: Payer: Self-pay

## 2019-09-29 ENCOUNTER — Ambulatory Visit (INDEPENDENT_AMBULATORY_CARE_PROVIDER_SITE_OTHER): Payer: No Payment, Other | Admitting: Licensed Clinical Social Worker

## 2019-09-29 DIAGNOSIS — F418 Other specified anxiety disorders: Secondary | ICD-10-CM | POA: Diagnosis not present

## 2019-09-30 NOTE — Progress Notes (Signed)
   THERAPIST PROGRESS NOTE  Session Time: 40 min.  Participation Level: Active  Behavioral Response: CasualAlertEuthymic  Type of Therapy: Individual Therapy  Treatment Goals addressed: Communication: Additional Assessment and Coping  Interventions: Motivational Interviewing and Supportive  Summary: [name] is a 44 y.o. female who presents with hx of anx/dep. Pt reports she is doing well overall. She has had no panic attacks since last session. She continues to have some normal work related stress but states she is getting all of her work done. Reports there were 2 incidents where son was misbehaving and she was able to remain calm with him, no yelling. She states a friend of hers from work, Lequita Halt, was over at her home and commented to pt how much calmer pt seemed to be. Pt feels her meds are becoming very effective along with support/education from counseling. Pt states she has an upcoming med management appt. States she is feeling "a little blue" the past few days, wanting to sleep more. Plans to ask about antidepressant maybe being increased slightly. Pt continues to use the relaxation techniques LCSW taught her with good results. LCSW asked pt if she would be willing to start walking outside 3xwk to help with coping and mood. Pt is willing and states she has a safe neighborhood to walk in which she will do with her son and maybe roommate. Assessed for her age appropriate discussion with son. Pt states she is "procrastinating" and reveals she is still uncertain what to say. Successfully role played with her. Pt will also write out what she plans to say. Pt reports she is going on a trip to [facility] 7/16. Son's godfather, Onalee Hua who is 59 and a retired Pensions consultant, takes her and son annually. She is looking forward to this break. LCSW reviewed poc. Pt would like to come in person for next session which was moved to accommodate beach trip. Pt states appreciation for care.  Suicidal/Homicidal:  Nowithout intent/plan  Therapist Response: Pt very receptive and participatory. Improvements in mood noted.  Plan: Return again in 3 weeks.  Diagnosis: Axis I: Anx with Dep    Axis II: Deferred  Freeman Sink, LCSW 09/30/2019

## 2019-10-04 ENCOUNTER — Telehealth (INDEPENDENT_AMBULATORY_CARE_PROVIDER_SITE_OTHER): Payer: No Payment, Other | Admitting: Psychiatry

## 2019-10-04 ENCOUNTER — Encounter (HOSPITAL_COMMUNITY): Payer: Self-pay | Admitting: Psychiatry

## 2019-10-04 ENCOUNTER — Other Ambulatory Visit: Payer: Self-pay

## 2019-10-04 DIAGNOSIS — F319 Bipolar disorder, unspecified: Secondary | ICD-10-CM | POA: Diagnosis not present

## 2019-10-04 MED ORDER — OLANZAPINE 10 MG PO TABS: 10.0000 mg | ORAL_TABLET | Freq: Every day | ORAL | 2 refills | Status: DC

## 2019-10-04 MED ORDER — GABAPENTIN 300 MG PO CAPS: 300.0000 mg | ORAL_CAPSULE | ORAL | 2 refills | Status: DC

## 2019-10-04 MED ORDER — SERTRALINE HCL 50 MG PO TABS: 50.0000 mg | ORAL_TABLET | Freq: Every day | ORAL | 2 refills | Status: DC

## 2019-10-04 NOTE — Progress Notes (Signed)
BH MD/PA/NP OP Progress Note Virtual Visit via Telephone Note  I connected with [name] on 10/04/19 at  8:15 AM EDT by telephone and verified that I am speaking with the correct person using two identifiers.  Location: Patient: home Provider: Clinic   I discussed the limitations, risks, security and privacy concerns of performing an evaluation and management service by telephone and the availability of in person appointments. I also discussed with the patient that there may be a patient responsible charge related to this service. The patient expressed understanding and agreed to proceed.   I provided 30 minutes of non-face-to-face time during this encounter.    10/04/2019 8:36 AM LIANNI KANAAN  MRN:  [id number]  Chief Complaint: "I like the Zyprexa" It seems to be working".  HPI: 44 year old female seen today follow-up psychiatric evaluation.  She has a psychiatric history of bipolar 1 disorder, anxiety, and depression.  She is currently being managed on Zyprexa 5 mg, Zoloft 50 mg, and gabapentin 300 mg am and gabapentin 600 HS.  She notes that her medications are effective in managing her psychiatric conditions.  Today patient denies symptoms of anxiety and depression.  She denies SI/HI/VH.  She informed Clinical research associate that things are progressively improving.  She notes that her coworker informed her that she is more calm at work.  She informed Clinical research associate that at times she becomes a little down however notes that she is able to turn her mood around.  She describes her mood as overall stable.  She reports that when she feels down she takes the advice of her therapist and walks.  She notes that this coping mechanism has been effective.  Patient requests that Zyprexa be increased to optimize improvement in symptoms.  She is agreeable to increase Zyprexa 5 mg to 10 mg as well as continue all other medications as prescribed.  No other concerns noted at this time. Visit Diagnosis:    ICD-10-CM   1.  Bipolar 1 disorder (HCC)  F31.9 gabapentin (NEURONTIN) 300 MG capsule    OLANZapine (ZYPREXA) 10 MG tablet    sertraline (ZOLOFT) 50 MG tablet    Past Psychiatric History:  bipolar 1 disorder, anxiety, and depression.   Past Medical History:  Past Medical History:  Diagnosis Date  . Bipolar 1 disorder (HCC)   . History of miscarriage   . Hypertension     Past Surgical History:  Procedure Laterality Date  . NO PAST SURGERIES    . TUBAL LIGATION N/A 04/13/2014   Procedure: POST PARTUM TUBAL LIGATION;  Surgeon: [name], MD;  Location: [facility];  Service: Gynecology;  Laterality: N/A;    Family Psychiatric History: Son Depression, Daughter ADHD and Depression, Father Bipolar disorder  Family History:  Family History  Problem Relation Age of Onset  . Cancer Mother 47       breast  . Cancer Maternal Grandmother 50       breast  . Cancer Maternal Grandfather        stomach  . Hypertension Father   . Heart disease Father     Social History:  Social History   Socioeconomic History  . Marital status: Divorced    Spouse name: Not on file  . Number of children: Not on file  . Years of education: Not on file  . Highest education level: Not on file  Occupational History  . Not on file  Tobacco Use  . Smoking status: Current Every Day Smoker    Packs/day:  0.50  . Smokeless tobacco: Never Used  Vaping Use  . Vaping Use: Never used  Substance and Sexual Activity  . Alcohol use: Yes    Alcohol/week: 24.0 standard drinks    Types: 24 Cans of beer per week  . Drug use: Yes    Types: Marijuana  . Sexual activity: Yes    Birth control/protection: Surgical  Other Topics Concern  . Not on file  Social History Narrative  . Not on file   Social Determinants of Health   Financial Resource Strain:   . Difficulty of Paying Living Expenses:   Food Insecurity:   . Worried About Programme researcher, broadcasting/film/video in the Last Year:   . Barista in the Last Year:   Transportation  Needs:   . Freight forwarder (Medical):   Marland Kitchen Lack of Transportation (Non-Medical):   Physical Activity:   . Days of Exercise per Week:   . Minutes of Exercise per Session:   Stress:   . Feeling of Stress :   Social Connections:   . Frequency of Communication with Friends and Family:   . Frequency of Social Gatherings with Friends and Family:   . Attends Religious Services:   . Active Member of Clubs or Organizations:   . Attends Banker Meetings:   Marland Kitchen Marital Status:     Allergies:  Allergies  Allergen Reactions  . Latex Rash  . Penicillins Rash    Has patient had a PCN reaction causing immediate rash, facial/tongue/throat swelling, SOB or lightheadedness with hypotension: Yes  Has patient had a PCN reaction causing severe rash involving mucus membranes or skin necrosis: No Has patient had a PCN reaction that required hospitalization No Has patient had a PCN reaction occurring within the last 10 years: No If all of the above answers are "NO", then may proceed with Cephalosporin use.     Metabolic Disorder Labs: No results found for: HGBA1C, MPG No results found for: PROLACTIN Lab Results  Component Value Date   CHOL 226 (H) 01/25/2008   TRIG 152 (H) 01/25/2008   HDL 63 01/25/2008   CHOLHDL 3.6 Ratio 01/25/2008   VLDL 30 01/25/2008   LDLCALC 133 (H) 01/25/2008   Lab Results  Component Value Date   TSH 1.370 02/16/2018   TSH 0.812 01/25/2008    Therapeutic Level Labs: No results found for: LITHIUM No results found for: VALPROATE No components found for:  CBMZ  Current Medications: Current Outpatient Medications  Medication Sig Dispense Refill  . azithromycin (ZITHROMAX) 250 MG tablet Take 2 pills the first day then 1 pill daily for 4 days 6 tablet 0  . gabapentin (NEURONTIN) 300 MG capsule Take 1-2 capsules (300-600 mg total) by mouth See admin instructions. Take 300 mg every morning and 600 mg every night at bedtime. 90 capsule 2  . ibuprofen  (ADVIL,MOTRIN) 200 MG tablet Take 200 mg by mouth every 6 (six) hours as needed for mild pain or moderate pain.    . medroxyPROGESTERone (PROVERA) 10 MG tablet Take 1 tablet (10 mg total) by mouth daily for 10 days. 10 tablet 0  . nicotine (NICODERM CQ - DOSED IN MG/24 HOURS) 21 mg/24hr patch Place 1 patch (21 mg total) onto the skin daily. 28 patch 1  . OLANZapine (ZYPREXA) 10 MG tablet Take 1 tablet (10 mg total) by mouth at bedtime. 30 tablet 2  . omeprazole (PRILOSEC) 20 MG capsule Take 20 mg by mouth daily.    Marland Kitchen  sertraline (ZOLOFT) 50 MG tablet Take 1 tablet (50 mg total) by mouth daily. 30 tablet 2   No current facility-administered medications for this visit.     Musculoskeletal: Strength & Muscle Tone: unable to assess due to telephone visit Gait & Station: unable to assess due to telephone visit Patient leans: N/A  Psychiatric Specialty Exam: Review of Systems  unknown if currently breastfeeding.There is no height or weight on file to calculate BMI.  General Appearance: unable to assess due to telephone visit  Eye Contact:  unable to assess due to telephone visit  Speech:  Clear and Coherent and Normal Rate  Volume:  Normal  Mood:  Euthymic  Affect:  Congruent  Thought Process:  Coherent, Goal Directed and Linear  Orientation:  Full (Time, Place, and Person)  Thought Content: WDL and Logical   Suicidal Thoughts:  No  Homicidal Thoughts:  No  Memory:  Immediate;   Good Recent;   Good Remote;   Good  Judgement:  Good  Insight:  Good  Psychomotor Activity:  Normal  Concentration:  Concentration: Good and Attention Span: Good  Recall:  Good  Fund of Knowledge: Good  Language: Good  Akathisia:  NA  Handed:  Right  AIMS (if indicated): not done  Assets:  Communication Skills Desire for Improvement Financial Resources/Insurance Housing Social Support  ADL's:  Intact  Cognition: WNL  Sleep:  Good   Screenings: GAD-7     Office Visit from 02/17/2018 in [facility] Office Visit from 02/16/2018 in [facility] Office Visit from 01/24/2018 in [facility]  Total GAD-7 Score 7 8 11     PHQ2-9     Office Visit from 02/17/2018 in [facility] Office Visit from 02/16/2018 in [facility] Office Visit from 01/24/2018 in [facility] MD EVALUATION AND MANAGEMENT from [facility]  PHQ-2 Total Score 2 2 6  0  PHQ-9 Total Score 7 7 15  --       Assessment and Plan: Patient reports that overall she is doing well on her current medications but notes that she would like to increase the dose of her zyprexa to optimize improvement of symptoms. Patient is agreeable to increasing dose of zyprexa from 5 mg to 10 mg daily as well as continuing all other medications as prescribed.   1. Bipolar 1 disorder (HCC) Continue- gabapentin (NEURONTIN) 300 MG capsule; Take 1-2 capsules (300-600 mg total) by mouth See admin instructions. Take 300 mg every morning and 600 mg every night at bedtime.  Dispense: 90 capsule; Refill: 2 Increased- OLANZapine (ZYPREXA) 10 MG tablet; Take 1 tablet (10 mg total) by mouth at bedtime.  Dispense: 30 tablet; Refill: 2 Continue- sertraline (ZOLOFT) 50 MG tablet; Take 1 tablet (50 mg total) by mouth daily.  Dispense: 30 tablet; Refill: 2  Follow up in 3 months Follow up with therapy.   01/31/2014, NP 10/04/2019, 8:36 AM

## 2019-10-13 ENCOUNTER — Ambulatory Visit (HOSPITAL_COMMUNITY): Payer: Self-pay | Admitting: Licensed Clinical Social Worker

## 2019-10-20 ENCOUNTER — Ambulatory Visit (INDEPENDENT_AMBULATORY_CARE_PROVIDER_SITE_OTHER): Payer: No Payment, Other | Admitting: Licensed Clinical Social Worker

## 2019-10-20 ENCOUNTER — Other Ambulatory Visit: Payer: Self-pay

## 2019-10-20 DIAGNOSIS — F418 Other specified anxiety disorders: Secondary | ICD-10-CM | POA: Diagnosis not present

## 2019-10-23 NOTE — Progress Notes (Signed)
   THERAPIST PROGRESS NOTE  Session Time: 50 min  Participation Level: Active  Behavioral Response: CasualAlertAnxious  Type of Therapy: Individual Therapy  Treatment Goals addressed: Anxiety and Coping  Interventions: Motivational Interviewing and Supportive  Summary: [name] is a 44 y.o. female who presents with hx of anx/dep. Pt reports she had a nice trip to the beach with her son and son's Elisha Ponder, Onalee Hua, until the last day. She reports having to call 911 for Onalee Hua as he was having a medical issue. She provides details. She states Onalee Hua refuses medical care and will not go to Dr regardless of it being obvious something is wrong. Refused 911 assist. She worries about this and states it was a very scary ride home from the beach. Onalee Hua has provided her ride for this appt today.) Assessed for son being impacted. She states she believes [name] is not worried. Pt reports she did have a conversation with son about her inappropriately yelling at him at times, which remains much improved. She feels good about having done this saying "It helped me". She expressed her love for him and apologized for her behavior. LCSW praised pt for following through with this conversation. Pt states even though she has other children she has never raised a child before. She states "I don't know what I am doing". Discussed parenting classes, pt open and interested. Will revisit options next session. LCSW assessed for s&s of anx/dep. Pt states her anx with panic is "not bad". She is feeling less dep and is no longer wanting to have excess sleep since last med adjustment. She does c/o about now having nightmares and increased appetite. She reports the nightmares are upsetting, she is eating at night and is concerned about this. LCSW facilitated pt being seen sooner than next scheduled med management appt via open access. Pt has not yet started walking since she was out of town and has been working some extra  hours. She states she will plan to start walking. Reviewed benefits again. LCSW reviewed poc with pt's verbal agreement. Pt states appreciation for care.       Suicidal/Homicidal: Nowithout intent/plan  Therapist Response: Pt remains receptive to care. Came for first in person session and will continue.  Plan: Return again in 2 weeks.  Diagnosis: Axis I: Axiety associated with depression    Axis II: Deferred  Platter Sink, LCSW 10/23/2019

## 2019-10-27 ENCOUNTER — Other Ambulatory Visit: Payer: Self-pay

## 2019-10-27 ENCOUNTER — Ambulatory Visit (INDEPENDENT_AMBULATORY_CARE_PROVIDER_SITE_OTHER): Payer: No Payment, Other | Admitting: Licensed Clinical Social Worker

## 2019-10-27 DIAGNOSIS — F418 Other specified anxiety disorders: Secondary | ICD-10-CM

## 2019-10-27 NOTE — Progress Notes (Signed)
   THERAPIST PROGRESS NOTE  Session Time: 36 min  Participation Level: Active  Behavioral Response: CasualAlertAnxious  Type of Therapy: Individual Therapy  Treatment Goals addressed: Anxiety and Coping  Interventions: Supportive and Reframing  Summary: [name] is a 44 y.o. female who presents with hx of anx/dep. Pt came for in person session. She is very anxious today and dressed for work as she is on her way to work. Pt reports relationship issues with both her roommate, [name], and son's [name]. She provides details. Pt becomes tearful about feeling she is a bad parent and not wanting to harm her son emotionally. She states "It may be too late". She denies she has recently yelled or mistreated her son. Reports son has been sick so he has not been acting out. Acknowledges son is getting mixed messages from the three adults in his life and this is what all the conflict is about. Assisted pt to process thoughts and feelings. Pt unexpectedly back for session in one wk rather than two so LCSW does not have info on parenting classes discussed at last session. Assured pt this would be part of next session (pt states she thinks scheduling mix up happened when she had to cancel d/t beach trip). Pt says son starts kindergarten Aug 16. Assessed for pt nightmares and overeating. Pt reports symptoms gone as she has been off of her Zyprexa for approx a wk. Pt states the dose was increased and she has been taking two 5mg  tablets since dose increased to 10mg  so she ran out early. Pt confirms she has refills but she is not sure pharmacy has new order. Uses . LCSW asks pt to call pharmacy while in office. She confirms pharmacy has correct dosage and will prepare refill. Pt plans to pick meds up and take until med management appt which was moved up to 8/10. Reviewed coping strategies. Pt states has been out walking with son and roommate, which she says felt good and she plans to  continue. Reports they all enjoyed the walk. LCSW reviewed poc prior to close of session. Pt states appreciation for care.    Suicidal/Homicidal: Nowithout intent/plan  Therapist Response: Pt open and receptive to care.  Plan: Return again in 2 weeks.  Diagnosis: Axis I: Anx with dep    Axis II: Deferred  , LCSW 10/27/2019

## 2019-11-07 ENCOUNTER — Ambulatory Visit (INDEPENDENT_AMBULATORY_CARE_PROVIDER_SITE_OTHER): Payer: No Payment, Other | Admitting: Psychiatry

## 2019-11-07 ENCOUNTER — Other Ambulatory Visit: Payer: Self-pay

## 2019-11-07 ENCOUNTER — Encounter (HOSPITAL_COMMUNITY): Payer: Self-pay | Admitting: Psychiatry

## 2019-11-07 DIAGNOSIS — F4323 Adjustment disorder with mixed anxiety and depressed mood: Secondary | ICD-10-CM | POA: Insufficient documentation

## 2019-11-07 DIAGNOSIS — F319 Bipolar disorder, unspecified: Secondary | ICD-10-CM

## 2019-11-07 MED ORDER — HYDROXYZINE HCL 10 MG PO TABS: 10.0000 mg | ORAL_TABLET | Freq: Three times a day (TID) | ORAL | 2 refills | Status: DC | PRN

## 2019-11-07 MED ORDER — GABAPENTIN 300 MG PO CAPS: 300.0000 mg | ORAL_CAPSULE | ORAL | 2 refills | Status: DC

## 2019-11-07 MED ORDER — OLANZAPINE 10 MG PO TABS: 10.0000 mg | ORAL_TABLET | Freq: Every day | ORAL | 2 refills | Status: DC

## 2019-11-07 MED ORDER — SERTRALINE HCL 50 MG PO TABS: 50.0000 mg | ORAL_TABLET | Freq: Every day | ORAL | 2 refills | Status: DC

## 2019-11-07 NOTE — Progress Notes (Signed)
BH MD/PA/NP OP Progress Note Virtual Visit via Telephone Note  I connected with [name] on 11/07/19 at  1:00 PM EDT by telephone and verified that I am speaking with the correct person using two identifiers.  Location: Patient: home Provider: Clinic   I discussed the limitations, risks, security and privacy concerns of performing an evaluation and management service by telephone and the availability of in person appointments. I also discussed with the patient that there may be a patient responsible charge related to this service. The patient expressed understanding and agreed to proceed.   I provided 30 minutes of non-face-to-face time during this encounter.    11/07/2019 1:35 PM Lindsey BrowCarrie R Gray  MRN:  [id number]  Chief Complaint: "I like the Zyprexa" It seems to be working".  HPI: 44 year old female seen today follow-up psychiatric evaluation.  She has a psychiatric history of bipolar 1 disorder, anxiety, and depression.  She is currently being managed on Zyprexa 10 mg, Zoloft 50 mg, and gabapentin 300 mg am and gabapentin 600 HS.  She notes that her medications are effective in managing her psychiatric conditions however notes that she has been having nightmares and is concerned its an affect of the medications.  Today patient denies symptoms of depression and SI/HI/VH. She notes that her mood is stable and denies symptoms of mania. She however reports that she has been experiencing nightmares for the last two weeks. Writer asked patient if there were any new or stressful changes in her life. She originally said there were no new changes however later noted that her son will be starting kindergarten. Writer informed patient that current medication regimen generally does not cause nightmares. She endorsed understanding and notes that this new transsission makes her afraid. She notes that she is more worried about him than he is himself.  She informed Clinical research associatewriter that she is planning to  purchase his school supplies this week. Provider informed patient that some anxiety is normal regarding her sons starting school. Patient encouraged to cope with anxiety utilizing positive coping mechanism such as positive thinking regarding his schooling and deep breathing. She endorsed understanding an agreed.     She is agreeable to starting hydroxyzine 10 mg TID to help manage anxiety. Potential side effects of medication and risks vs benefits of treatment vs non-treatment were explained and discussed. All questions were answered. She will continue all other medications as prescribed and follow up with outpatient counselor for therapy.  No other concerns noted at this time. Visit Diagnosis:    ICD-10-CM   1. Adjustment disorder with mixed anxiety and depressed mood  F43.23 hydrOXYzine (ATARAX/VISTARIL) 10 MG tablet  2. Bipolar 1 disorder (HCC)  F31.9 gabapentin (NEURONTIN) 300 MG capsule    OLANZapine (ZYPREXA) 10 MG tablet    sertraline (ZOLOFT) 50 MG tablet    Past Psychiatric History:  bipolar 1 disorder, anxiety, and depression.   Past Medical History:  Past Medical History:  Diagnosis Date  . Bipolar 1 disorder (HCC)   . History of miscarriage   . Hypertension     Past Surgical History:  Procedure Laterality Date  . NO PAST SURGERIES    . TUBAL LIGATION N/A 04/13/2014   Procedure: POST PARTUM TUBAL LIGATION;  Surgeon: Jaymes GraffNaima Dillard, MD;  Location: [facility];  Service: Gynecology;  Laterality: N/A;    Family Psychiatric History: Son Depression, Daughter ADHD and Depression, Father Bipolar disorder  Family History:  Family History  Problem Relation Age of Onset  . Cancer Mother  56       breast  . Cancer Maternal Grandmother 50       breast  . Cancer Maternal Grandfather        stomach  . Hypertension Father   . Heart disease Father     Social History:  Social History   Socioeconomic History  . Marital status: Divorced    Spouse name: Not on file  . Number of  children: Not on file  . Years of education: Not on file  . Highest education level: Not on file  Occupational History  . Not on file  Tobacco Use  . Smoking status: Current Every Day Smoker    Packs/day: 0.50  . Smokeless tobacco: Never Used  Vaping Use  . Vaping Use: Never used  Substance and Sexual Activity  . Alcohol use: Yes    Alcohol/week: 24.0 standard drinks    Types: 24 Cans of beer per week  . Drug use: Yes    Types: Marijuana  . Sexual activity: Yes    Birth control/protection: Surgical  Other Topics Concern  . Not on file  Social History Narrative  . Not on file   Social Determinants of Health   Financial Resource Strain:   . Difficulty of Paying Living Expenses:   Food Insecurity:   . Worried About Programme researcher, broadcasting/film/video in the Last Year:   . Barista in the Last Year:   Transportation Needs:   . Freight forwarder (Medical):   Marland Kitchen Lack of Transportation (Non-Medical):   Physical Activity:   . Days of Exercise per Week:   . Minutes of Exercise per Session:   Stress:   . Feeling of Stress :   Social Connections:   . Frequency of Communication with Friends and Family:   . Frequency of Social Gatherings with Friends and Family:   . Attends Religious Services:   . Active Member of Clubs or Organizations:   . Attends Banker Meetings:   Marland Kitchen Marital Status:     Allergies:  Allergies  Allergen Reactions  . Latex Rash  . Penicillins Rash    Has patient had a PCN reaction causing immediate rash, facial/tongue/throat swelling, SOB or lightheadedness with hypotension: Yes  Has patient had a PCN reaction causing severe rash involving mucus membranes or skin necrosis: No Has patient had a PCN reaction that required hospitalization No Has patient had a PCN reaction occurring within the last 10 years: No If all of the above answers are "NO", then may proceed with Cephalosporin use.     Metabolic Disorder Labs: No results found for: HGBA1C,  MPG No results found for: PROLACTIN Lab Results  Component Value Date   CHOL 226 (H) 01/25/2008   TRIG 152 (H) 01/25/2008   HDL 63 01/25/2008   CHOLHDL 3.6 Ratio 01/25/2008   VLDL 30 01/25/2008   LDLCALC 133 (H) 01/25/2008   Lab Results  Component Value Date   TSH 1.370 02/16/2018   TSH 0.812 01/25/2008    Therapeutic Level Labs: No results found for: LITHIUM No results found for: VALPROATE No components found for:  CBMZ  Current Medications: Current Outpatient Medications  Medication Sig Dispense Refill  . azithromycin (ZITHROMAX) 250 MG tablet Take 2 pills the first day then 1 pill daily for 4 days 6 tablet 0  . gabapentin (NEURONTIN) 300 MG capsule Take 1-2 capsules (300-600 mg total) by mouth See admin instructions. Take 300 mg every morning and 600 mg every  night at bedtime. 90 capsule 2  . hydrOXYzine (ATARAX/VISTARIL) 10 MG tablet Take 1 tablet (10 mg total) by mouth 3 (three) times daily as needed. 90 tablet 2  . ibuprofen (ADVIL,MOTRIN) 200 MG tablet Take 200 mg by mouth every 6 (six) hours as needed for mild pain or moderate pain.    . medroxyPROGESTERone (PROVERA) 10 MG tablet Take 1 tablet (10 mg total) by mouth daily for 10 days. 10 tablet 0  . OLANZapine (ZYPREXA) 10 MG tablet Take 1 tablet (10 mg total) by mouth at bedtime. 30 tablet 2  . omeprazole (PRILOSEC) 20 MG capsule Take 20 mg by mouth daily.    . sertraline (ZOLOFT) 50 MG tablet Take 1 tablet (50 mg total) by mouth daily. 30 tablet 2   No current facility-administered medications for this visit.     Musculoskeletal: Strength & Muscle Tone: unable to assess due to telephone visit Gait & Station: unable to assess due to telephone visit Patient leans: N/A  Psychiatric Specialty Exam: Review of Systems  unknown if currently breastfeeding.There is no height or weight on file to calculate BMI.  General Appearance: unable to assess due to telephone visit  Eye Contact:  unable to assess due to telephone  visit  Speech:  Clear and Coherent and Normal Rate  Volume:  Normal  Mood:  Anxious  Affect:  Congruent  Thought Process:  Coherent, Goal Directed and Linear  Orientation:  Full (Time, Place, and Person)  Thought Content: WDL and Logical   Suicidal Thoughts:  No  Homicidal Thoughts:  No  Memory:  Immediate;   Good Recent;   Good Remote;   Good  Judgement:  Good  Insight:  Good  Psychomotor Activity:  Normal  Concentration:  Concentration: Good and Attention Span: Good  Recall:  Good  Fund of Knowledge: Good  Language: Good  Akathisia:  NA  Handed:  Right  AIMS (if indicated): not done  Assets:  Communication Skills Desire for Improvement Financial Resources/Insurance Housing Social Support  ADL's:  Intact  Cognition: WNL  Sleep:  Good   Screenings: GAD-7     Office Visit from 02/17/2018 in [facility] Office Visit from 02/16/2018 in [facility] Office Visit from 01/24/2018 in [facility]  Total GAD-7 Score 7 8 11     PHQ2-9     Office Visit from 02/17/2018 in [facility] Office Visit from 02/16/2018 in [facility] Office Visit from 01/24/2018 in [facility] EVALUATION AND MANAGEMENT from 12[facility]01/2013 [facility]  PHQ-2 Total Score 2 2 6  0  PHQ-9 Total Score 7 7 15  --       Assessment and Plan: Patient notes that she has been experiencing increased anxiety and nightmares. She relates her anxiety and nightmares to her son starting school. She is agreeable to starting hydroxyzine 10 mg three times daily. She will continue all other medications as prescribed.   1. Bipolar 1 disorder (HCC) Continue- gabapentin (NEURONTIN) 300 MG capsule; Take 1-2 capsules (300-600 mg total) by mouth See admin instructions. Take 300 mg every morning and 600 mg every night at bedtime.  Dispense: 90  capsule; Refill: 2 Increased- OLANZapine (ZYPREXA) 10 MG tablet; Take 1 tablet (10 mg total) by mouth at bedtime.  Dispense: 30 tablet; Refill: 2 Continue- sertraline (ZOLOFT) 50 MG tablet; Take 1 tablet (50 mg total) by mouth daily.  Dispense: 30 tablet; Refill: 2  2. Adjustment disorder with mixed anxiety and depressed mood Start- hydrOXYzine (ATARAX/VISTARIL) 10 MG tablet; Take 1 tablet (10 mg total) by mouth 3 (three) times daily as needed.  Dispense: 90 tablet; Refill: 2   Follow up in 3 months Follow up with therapy.   Shanna Cisco, NP 11/07/2019, 1:35 PM

## 2019-11-10 ENCOUNTER — Other Ambulatory Visit: Payer: Self-pay

## 2019-11-10 ENCOUNTER — Ambulatory Visit (INDEPENDENT_AMBULATORY_CARE_PROVIDER_SITE_OTHER): Payer: No Payment, Other | Admitting: Licensed Clinical Social Worker

## 2019-11-10 DIAGNOSIS — F418 Other specified anxiety disorders: Secondary | ICD-10-CM | POA: Diagnosis not present

## 2019-11-11 NOTE — Progress Notes (Signed)
   THERAPIST PROGRESS NOTE  Session Time: 25 min  Participation Level: Active  Behavioral Response: CasualAlertEuthymic  Type of Therapy: Individual Therapy  Treatment Goals addressed: Anxiety and Coping  Interventions: Solution Focused and Supportive  Summary: [name] is a 44 y.o. female who presents with hx of anx/dep. Pt states "great" when asked how she is doing. Pt is pleased she got meds after last session and stayed on meds until med management appt 8/10. She states that appt went well and she is feeling so much better emotionally. She advises she is still having dreams which will be readdressed after additional time on meds. Reports relationships with Eunice Blase and Onalee Hua are improved. She feels Onalee Hua is still going to be challenging as "he won't listen to me" when it comes to her son. States Onalee Hua gives her son "whatever he wants". She states she is reading a book on boundaries so she can try to set better boundaries with Onalee Hua. She advises her son is feeling better physically but is also doing better with being on a schedule. Pt was having difficulty getting son to go to bed at a reasonable time. She states her friend Lequita Halt told her to try melatonin which she has. She reports it helps him get to sleep shortly after it is given. She states she reads him a story after med given and it is working well. He is waking up in a better mood since getting adequate rest. They will have open house at his new school this afternoon. School starts 8/16. Assessed for ongoing walking. Pt advises they are still walking as the weather allows and it is helpful for all. One small issue/stressor at work which she hopes will be worked out today r/t hours. Remainder of session spent explaining parenting resource to pt. LCSW pulled up website: Parents as Architectural technologist for [employer] over services and provided all contact info. Pt agrees to call them today to initiate services. Session ended early so she can  call and get to work on time. Pt very appreciative. She states "I feel like a million bucks and the most like myself in over 3 years". Reports she is waking up early and wants to do things. LCSW commended pt for all her attention and efforts. LCSW reviewed poc with pt's verbal agreement prior to close of session.     Suicidal/Homicidal: Nowithout intent/plan  Therapist Response: Pt remains receptive to care.  Plan: Return again in 2 weeks.  Diagnosis: Axis I: Anxiety with depression    Axis II: Deferred  [place] Sink, LCSW 11/11/2019

## 2019-11-24 ENCOUNTER — Ambulatory Visit (HOSPITAL_COMMUNITY): Payer: Self-pay | Admitting: Licensed Clinical Social Worker

## 2019-12-08 ENCOUNTER — Ambulatory Visit (HOSPITAL_COMMUNITY): Payer: Self-pay | Admitting: Licensed Clinical Social Worker

## 2019-12-22 ENCOUNTER — Other Ambulatory Visit: Payer: Self-pay

## 2019-12-22 ENCOUNTER — Ambulatory Visit (INDEPENDENT_AMBULATORY_CARE_PROVIDER_SITE_OTHER): Payer: No Payment, Other | Admitting: Licensed Clinical Social Worker

## 2019-12-22 DIAGNOSIS — F418 Other specified anxiety disorders: Secondary | ICD-10-CM | POA: Diagnosis not present

## 2019-12-25 NOTE — Progress Notes (Signed)
   THERAPIST PROGRESS NOTE  Session Time: 45 min  Participation Level: Active  Behavioral Response: CasualAlertMildly anxious  Type of Therapy: Individual Therapy  Treatment Goals addressed: Anxiety and Coping  Interventions: Motivational Interviewing and Supportive  Summary: [name] is a 44 y.o. female who presents with hx of anx/dep. This date pt comes for in person session per her preference. Pt reports she is doing fairly well overall. Pt states she has new hours at work, Mon-Fri 8a-2p. She states her friend [name] went to work at [employer] and she has these hours now. She states she is doing a great job of getting her work done and likes this shift, saying she ends up dealing with less people. Her income has gone up so this is a help to the household. LCSW assessed for outcome of referral to Parents as Teachers. Pt states when she spoke with [name], friend she lives with, about this [name] declined as it would mean someone coming into the home. Pt reports they have 5 dogs, 5 cats, hamsters and a few other animals. She states the floors are in poor condition and says [name] "starts drinking at 10am". Pt states she "paces herself" so she seldom gets drunk. Pt states [name] started this drinking behavior shortly after her mother died in Jul 20, 2003. Pt states [name] is "doing great". Pt reports he loves school and is doing well in school with the exception of reading. She denies any yelling at [name]. He is eager to do his homework. She states she does have ongoing difficulty with godfather, [name], and has still yelled at [name] in front of [name]. She reports [name] often curses and does so in front of [name], which sets her off. She is insightful yelling is not what she needs or wants to be doing. Pt reports she has been thinking she may get some parenting books since the other program will not be a fit. LCSW encouraged her going to Library for books and advised LCSW will see if  there are other options for her to consider for free classes. LCSW assessed for status of meds. Pt reports she is taking as prescribed. She feels she could use another adjustment of 5 mg in antidepressant. LCSW facilitated communication with [name] re same. LCSW assessed for coping and dealing with anxiety/anger r/t [name]. Discussed rubber band techique when escalating and pt had heard of technique. She states "I used to keep change in my pocket" which she used to fiddle with if she knew she was starting to escalate with anx/irritability. Pt agrees to again implement this plan. She states they are all continuing to walk. She reports they all went to the fair, son's first fair, and how nice it was to be out doing something fun. May go to fair in [place]. Pt reports she is thinking about going back to church and taking son. She reports on an encounter she had with a stranger from [place] [employer] she felt was a "God thing". LCSW strongly encouraged her and her son's attendance for another positive outlet. LCSW reviewed poc with pt's verbal acknowledgement prior to close of session. Pt states appreciation for care.      Suicidal/Homicidal: Nowithout intent/plan  Therapist Response: Pt remains receptive to care.  Plan: Return again in 2 weeks.  Diagnosis: Axis I: Anxiety associated with depression    Axis II: Deferred  [place] Sink, LCSW 12/25/2019

## 2020-01-04 ENCOUNTER — Telehealth (HOSPITAL_COMMUNITY): Payer: Self-pay | Admitting: Psychiatry

## 2020-01-26 ENCOUNTER — Ambulatory Visit (INDEPENDENT_AMBULATORY_CARE_PROVIDER_SITE_OTHER): Payer: No Payment, Other | Admitting: Licensed Clinical Social Worker

## 2020-01-26 ENCOUNTER — Other Ambulatory Visit: Payer: Self-pay

## 2020-01-26 DIAGNOSIS — F418 Other specified anxiety disorders: Secondary | ICD-10-CM

## 2020-01-29 NOTE — Progress Notes (Signed)
   THERAPIST PROGRESS NOTE   Virtual Visit via Telephone Note  I connected with [name] on 01/26/20 at  4:00 PM EDT by telephone and verified that I am speaking with the correct person using two identifiers.  Location: Patient: Home Provider: [facility]   I discussed the limitations, risks, security and privacy concerns of performing an evaluation and management service by telephone and the availability of in person appointments. I also discussed with the patient that there may be a patient responsible charge related to this service. The patient expressed understanding and agreed to proceed. I discussed the assessment and treatment plan with the patient. The patient was provided an opportunity to ask questions and all were answered. The patient agreed with the plan and demonstrated an understanding of the instructions.   I provided 40 minutes of non-face-to-face time during this encounter.  Participation Level: Active  Behavioral Response: CasualAlertEuthymic  Type of Therapy: Individual Therapy  Treatment Goals addressed: Anxiety and Coping  Interventions: Motivational Interviewing, Supportive and Other: Interpersonal skills  Summary: [name] is a 44 y.o. female who presents with hx of anx/dep. This date pt signs on for video session after coming for walk in appt. There was technical difficulty with video sound. Pt agrees to phone session. Pt reports she is "doing good". Pt states she is on a schedule at this time with work and caring for her son. She states son is "doing great". She advises he is much more calm since she has improved her anxious and irritable behaviors as well as since starting school. Pt reports she spoke with son's teacher today and he is "right on target with everything" per teacher. Pt states she is continuing with her new work hours giving her less unoccupied time. When she gets off work she helps son with homework, prepares dinner and helps son get ready  for bed. Pt taking meds as prescribed and feels she is well managed. Sleeping well. She does however report a 30lb weight gan since starting meds. Pt reports no distress over this. LCSW explored contributing factors of weight gain beyond meds. Pt states she is much more hungry and is eating sweets late at night. Explored options for this behavior, pt states "I can't help it". Reviewed self talk. Pt acknowledges. Assessed for status of trip to [employer] and church. Pt has done neither reporting the adjustment to more work hours is contributing to her long standing tendency to "procrastinate". Assessed for status of realtionship/stresses with Onalee Hua. Pt provides details of some positives and negatives. She did not provide any positive reinforcement for his positive behaviors when asked. LCSW instructed on impacts of positive reinforcement. Pt verbalizes understanding with intent to engage in this communication.. Pt states "I have a piece of good news". She advises her brother, [name], who is 41 yrs younger is going to be having a baby. Pt states "I'm going to be an aunt for the first time". She provides details and is clearly very excited. LCSW provided congratulations. LCSW reviewed coping strategies and poc including scheduling prior to close of session. Pt states appreciation for care.  Suicidal/Homicidal: Nowithout intent/plan  Therapist Response: Pt remains receptive to care.  Plan: Return again in 4 weeks.  Diagnosis: Axis I: Anxiety with depression    Axis II: Deferred  [place] Sink, LCSW 01/29/2020

## 2020-02-05 ENCOUNTER — Other Ambulatory Visit: Payer: Self-pay

## 2020-02-05 ENCOUNTER — Ambulatory Visit (INDEPENDENT_AMBULATORY_CARE_PROVIDER_SITE_OTHER): Payer: No Payment, Other | Admitting: Psychiatry

## 2020-02-05 ENCOUNTER — Encounter (HOSPITAL_COMMUNITY): Payer: Self-pay | Admitting: Psychiatry

## 2020-02-05 DIAGNOSIS — F319 Bipolar disorder, unspecified: Secondary | ICD-10-CM | POA: Diagnosis not present

## 2020-02-05 DIAGNOSIS — F4323 Adjustment disorder with mixed anxiety and depressed mood: Secondary | ICD-10-CM

## 2020-02-05 MED ORDER — OLANZAPINE 10 MG PO TABS: 10.0000 mg | ORAL_TABLET | Freq: Every day | ORAL | 2 refills | Status: DC

## 2020-02-05 MED ORDER — SERTRALINE HCL 50 MG PO TABS: 50.0000 mg | ORAL_TABLET | Freq: Every day | ORAL | 2 refills | Status: DC

## 2020-02-05 MED ORDER — GABAPENTIN 300 MG PO CAPS: 300.0000 mg | ORAL_CAPSULE | ORAL | 2 refills | Status: DC

## 2020-02-05 MED ORDER — HYDROXYZINE HCL 10 MG PO TABS: 10.0000 mg | ORAL_TABLET | Freq: Three times a day (TID) | ORAL | 2 refills | Status: DC | PRN

## 2020-02-05 NOTE — Progress Notes (Signed)
BH MD/PA/NP OP Progress Note Virtual Visit via Telephone Note  I connected with [name] on 02/05/20 at  4:00 PM EST by telephone and verified that I am speaking with the correct person using two identifiers.  Location: Patient: home Provider: Clinic   I discussed the limitations, risks, security and privacy concerns of performing an evaluation and management service by telephone and the availability of in person appointments. I also discussed with the patient that there may be a patient responsible charge related to this service. The patient expressed understanding and agreed to proceed.   I provided 30 minutes of non-face-to-face time during this encounter.    02/05/2020 4:23 PM [name]  MRN:  [id number]  Chief Complaint:  Chief Complaint    Follow-up; Medication Management    "things are better than they were before".  HPI: 44 year old female seen today follow-up psychiatric evaluation.  She has a psychiatric history of bipolar 1 disorder, anxiety, and depression.  She is currently being managed on Zyprexa 10 mg, Zoloft 50 mg, hydroxyzine 10 mg three times daily, gabapentin 300 mg am and gabapentin 600 HS.  She notes that her medications are effective in managing her psychiatric conditions.  Today well-groomed, pleasant, cooperative, engaged in conversation, and maintains eye contact.  She notes that since her last visit things have improved and denies anxiety but endorses occasional depression.  Provider conducted a GAD-7 and patient scored a 0.  Provider also conducted a PHQ-9 and patient scored a 10.  She informed provider that her mood has been stable and denies symptoms of mania, SI/HI/VH or paranoia.   Patient notes that she continues to have weird dreams about her parents, moving, and living with her mother.  She denies having nightmares.  Provider informed patient that dreaming is a normal process.  She endorsed understanding.  She also informed provider that she  is less anxious about her son attending school.  She notes that he loves kindergarten and reports that his behavior is better because of structure.    Patient noted that she only takes hydroxyzine when things are hectic at work. No medication changes made today.  Patient agreeable to continue medications as prescribed.  She will follow up with outpatient counselor for therapy.  No other concerns noted at this time. Visit Diagnosis:    ICD-10-CM   1. Bipolar 1 disorder (HCC)  F31.9 gabapentin (NEURONTIN) 300 MG capsule    OLANZapine (ZYPREXA) 10 MG tablet    sertraline (ZOLOFT) 50 MG tablet  2. Adjustment disorder with mixed anxiety and depressed mood  F43.23 hydrOXYzine (ATARAX/VISTARIL) 10 MG tablet    Past Psychiatric History:  bipolar 1 disorder, anxiety, and depression.   Past Medical History:  Past Medical History:  Diagnosis Date  . Bipolar 1 disorder (HCC)   . History of miscarriage   . Hypertension     Past Surgical History:  Procedure Laterality Date  . NO PAST SURGERIES    . TUBAL LIGATION N/A 04/13/2014   Procedure: POST PARTUM TUBAL LIGATION;  Surgeon: Jaymes GraffNaima Dillard, MD;  Location: [facility];  Service: Gynecology;  Laterality: N/A;    Family Psychiatric History: Son Depression, Daughter ADHD and Depression, Father Bipolar disorder  Family History:  Family History  Problem Relation Age of Onset  . Cancer Mother [id number]       breast  . Cancer Maternal Grandmother 50       breast  . Cancer Maternal Grandfather        stomach  .  Hypertension Father   . Heart disease Father     Social History:  Social History   Socioeconomic History  . Marital status: Divorced    Spouse name: Not on file  . Number of children: Not on file  . Years of education: Not on file  . Highest education level: Not on file  Occupational History  . Not on file  Tobacco Use  . Smoking status: Current Every Day Smoker    Packs/day: 0.50  . Smokeless tobacco: Never Used  Vaping Use  . Vaping  Use: Never used  Substance and Sexual Activity  . Alcohol use: Yes    Alcohol/week: 24.0 standard drinks    Types: 24 Cans of beer per week  . Drug use: Yes    Types: Marijuana  . Sexual activity: Yes    Birth control/protection: Surgical  Other Topics Concern  . Not on file  Social History Narrative  . Not on file   Social Determinants of Health   Financial Resource Strain:   . Difficulty of Paying Living Expenses: Not on file  Food Insecurity:   . Worried About Programme researcher, broadcasting/film/video in the Last Year: Not on file  . Ran Out of Food in the Last Year: Not on file  Transportation Needs:   . Lack of Transportation (Medical): Not on file  . Lack of Transportation (Non-Medical): Not on file  Physical Activity:   . Days of Exercise per Week: Not on file  . Minutes of Exercise per Session: Not on file  Stress:   . Feeling of Stress : Not on file  Social Connections:   . Frequency of Communication with Friends and Family: Not on file  . Frequency of Social Gatherings with Friends and Family: Not on file  . Attends Religious Services: Not on file  . Active Member of Clubs or Organizations: Not on file  . Attends Banker Meetings: Not on file  . Marital Status: Not on file    Allergies:  Allergies  Allergen Reactions  . Latex Rash  . Penicillins Rash    Has patient had a PCN reaction causing immediate rash, facial/tongue/throat swelling, SOB or lightheadedness with hypotension: Yes  Has patient had a PCN reaction causing severe rash involving mucus membranes or skin necrosis: No Has patient had a PCN reaction that required hospitalization No Has patient had a PCN reaction occurring within the last 10 years: No If all of the above answers are "NO", then may proceed with Cephalosporin use.     Metabolic Disorder Labs: No results found for: HGBA1C, MPG No results found for: PROLACTIN Lab Results  Component Value Date   CHOL 226 (H) 01/25/2008   TRIG 152 (H)  01/25/2008   HDL 63 01/25/2008   CHOLHDL 3.6 Ratio 01/25/2008   VLDL 30 01/25/2008   LDLCALC 133 (H) 01/25/2008   Lab Results  Component Value Date   TSH 1.370 02/16/2018   TSH 0.812 01/25/2008    Therapeutic Level Labs: No results found for: LITHIUM No results found for: VALPROATE No components found for:  CBMZ  Current Medications: Current Outpatient Medications  Medication Sig Dispense Refill  . gabapentin (NEURONTIN) 300 MG capsule Take 1-2 capsules (300-600 mg total) by mouth See admin instructions. Take 300 mg every morning and 600 mg every night at bedtime. 90 capsule 2  . hydrOXYzine (ATARAX/VISTARIL) 10 MG tablet Take 1 tablet (10 mg total) by mouth 3 (three) times daily as needed. 90 tablet 2  .  ibuprofen (ADVIL,MOTRIN) 200 MG tablet Take 200 mg by mouth every 6 (six) hours as needed for mild pain or moderate pain.    Marland Kitchen OLANZapine (ZYPREXA) 10 MG tablet Take 1 tablet (10 mg total) by mouth at bedtime. 30 tablet 2  . omeprazole (PRILOSEC) 20 MG capsule Take 20 mg by mouth daily.    . sertraline (ZOLOFT) 50 MG tablet Take 1 tablet (50 mg total) by mouth daily. 30 tablet 2   No current facility-administered medications for this visit.     Musculoskeletal: Strength & Muscle Tone: within normal limits Gait & Station: normal Patient leans: N/A  Psychiatric Specialty Exam: Review of Systems  Blood pressure (!) 156/95, pulse 74, height 5\' 6"  (1.676 m), weight 162 lb (73.5 kg), SpO2 100 %, unknown if currently breastfeeding.Body mass index is 26.15 kg/m.  General Appearance: Well Groomed  Eye Contact:  Good  Speech:  Clear and Coherent and Normal Rate  Volume:  Normal  Mood:  Euthymic  Affect:  Congruent  Thought Process:  Coherent, Goal Directed and Linear  Orientation:  Full (Time, Place, and Person)  Thought Content: WDL and Logical   Suicidal Thoughts:  No  Homicidal Thoughts:  No  Memory:  Immediate;   Good Recent;   Good Remote;   Good  Judgement:  Good   Insight:  Good  Psychomotor Activity:  Normal  Concentration:  Concentration: Good and Attention Span: Good  Recall:  Good  Fund of Knowledge: Good  Language: Good  Akathisia:  NA  Handed:  Right  AIMS (if indicated): not done  Assets:  Communication Skills Desire for Improvement Financial Resources/Insurance Housing Social Support  ADL's:  Intact  Cognition: WNL  Sleep:  Good   Screenings: GAD-7     Clinical Support from 02/05/2020 in [facility] Office Visit from 02/17/2018 in [facility] Office Visit from 02/16/2018 in Center for [facility] Office Visit from 01/24/2018 in Center for [facility]  Total GAD-7 Score 0 7 8 11     PHQ2-9     Clinical Support from 02/05/2020 in [facility] Office Visit from 02/17/2018 in [facility] Office Visit from 02/16/2018 in Center for [facility] Office Visit from 01/24/2018 in Center for [facility] MD EVALUATION AND MANAGEMENT from 12/01/2013 [facility]  PHQ-2 Total Score 2 2 2 6  0  PHQ-9 Total Score 10 7 7 15  --       Assessment and Plan: Patient notes that she is doing well on her current medication regimen.  No medication changes made today.  Patient agreeable to the medications as prescribed.  1. Bipolar 1 disorder (HCC)  Continue- gabapentin (NEURONTIN) 300 MG capsule; Take 1-2 capsules (300-600 mg total) by mouth See admin instructions. Take 300 mg every morning and 600 mg every night at bedtime.  Dispense: 90 capsule; Refill: 2 Continue- OLANZapine (ZYPREXA) 10 MG tablet; Take 1 tablet (10 mg total) by mouth at bedtime.  Dispense: 30 tablet; Refill: 2 Continue- sertraline (ZOLOFT) 50 MG tablet; Take 1 tablet (50 mg total) by mouth daily.  Dispense: 30 tablet; Refill: 2  2. Adjustment disorder with mixed anxiety and depressed  mood  Continue- hydrOXYzine (ATARAX/VISTARIL) 10 MG tablet; Take 1 tablet (10 mg total) by mouth 3 (three) times daily as needed.  Dispense: 90 tablet; Refill: 2    Follow up in 3 months Follow up with  therapy.   Shanna Cisco, NP 02/05/2020, 4:23 PM

## 2020-03-01 ENCOUNTER — Other Ambulatory Visit: Payer: Self-pay

## 2020-03-01 ENCOUNTER — Ambulatory Visit (HOSPITAL_COMMUNITY): Payer: No Payment, Other | Admitting: Licensed Clinical Social Worker

## 2020-03-01 DIAGNOSIS — F418 Other specified anxiety disorders: Secondary | ICD-10-CM

## 2020-03-04 NOTE — Progress Notes (Signed)
   THERAPIST PROGRESS NOTE  Session Time: 45 min  Participation Level: Active  Behavioral Response: CasualAlertEuthymic  Type of Therapy: Individual Therapy  Treatment Goals addressed: Communication: Anxiety, Depression, Coping  Interventions: Motivational Interviewing and Supportive  Summary: [name] is a 44 y.o. female who presents with hx of anx/dep. This date pt comes for in person session per her preference. Pt presents in a positive and uplifted mood. Pt states she is "excited about Christmas". Pt reports she is going to pick out their tree today and decorate over the weekend. She speaks of how well her son, [name], is doing and how excited he is about Christmas. LCSW facilitated reminiscence therapy r/t their traditions and all they will do this season. Pt states she feels she is still well managed with anx/dep med regimen. She states sometimes she feels like her Zyprexa may still need to be increased. She states she will talk with med management provider about same next appt. Pt sleeping well, no lashing out or yelling. Pt reports some frustration with work hours being reduced, which LCSW assisted to process. She will find the right time to communicate with boss. Pt states she followed through with getting library cards for self and son and has taken son to [employer] twice. Reports she found some parenting books for self but has not yet read the books. LCSW commended pt for follow through. LCSW assessed for any new worries/concerns, which pt denies. LCSW reviewed poc including scheduling and walk in options. Pt verbalizes agreement and appreciation for care.     Suicidal/Homicidal: Nowithout intent/plan  Therapist Response: Pt remains receptive to care.  Plan: Return again in 2 weeks.  Diagnosis: Axis I: Anxiety with depression    Axis II: Deferred   Sink, LCSW 03/04/2020

## 2020-03-15 ENCOUNTER — Ambulatory Visit (INDEPENDENT_AMBULATORY_CARE_PROVIDER_SITE_OTHER): Payer: No Payment, Other | Admitting: Licensed Clinical Social Worker

## 2020-03-15 ENCOUNTER — Other Ambulatory Visit: Payer: Self-pay

## 2020-03-15 DIAGNOSIS — F418 Other specified anxiety disorders: Secondary | ICD-10-CM

## 2020-03-18 NOTE — Progress Notes (Signed)
° °  THERAPIST PROGRESS NOTE  Session Time: 45 min  Participation Level: Active  Behavioral Response: CasualAlertAnxious  Type of Therapy: Individual Therapy  Treatment Goals addressed: Anger, Anxiety and Coping  Interventions: Motivational Interviewing, Solution Focused and Supportive  Summary: [name] is a 44 y.o. female who presents with hx of anx/dep. This date pt comes for in person session per her preference. Pt states she continues to be excited about Christmas for self and for her young son. She provides updates. Pt reports her son will be starting at new day care, [employer], on Monday and they are pleased his name came up so quickly. LCSW assesses for coping with anx/dep. Pt reports she has had "3 episodes" of yelling and cursing, losing control and she did so in front of her son. She describes what happened in detail each time. Pt reports much regret and tearfulness after the fact. LCSW assisted to process thoughts/feelings/solutions. Addressed anger management. Assessed for pt's willingness to attend anger management classes at [facility] facilitated by [facility]. LCSW provided education on process. Pt states she would like to attend. LCSW phoned [facility] to get next start dates. LCSW provided contact info for pt to call and schedule orientation for class beginning Jan. 22nd. Pt feels good she is taking this step to gain better self control. Reviewed other coping strategies and poc prior to close of session. Pt again states she believes she may need an increase in her Zyprexa. LCSW sent secure message to provider re same who states she will f/u with pt on Monday. Pt informed post session via phone. Pt states much appreciation for all care.      Suicidal/Homicidal: Nowithout intent/plan  Therapist Response: Pt remains receptive to care.  Plan: Return again in ~4 weeks.  Diagnosis: Axis I: Anxiety with depression    Axis II: Deferred  [place] Sink, LCSW 03/18/2020

## 2020-05-03 ENCOUNTER — Ambulatory Visit (INDEPENDENT_AMBULATORY_CARE_PROVIDER_SITE_OTHER): Payer: No Payment, Other | Admitting: Licensed Clinical Social Worker

## 2020-05-03 ENCOUNTER — Other Ambulatory Visit: Payer: Self-pay

## 2020-05-03 DIAGNOSIS — F418 Other specified anxiety disorders: Secondary | ICD-10-CM | POA: Diagnosis not present

## 2020-05-06 ENCOUNTER — Other Ambulatory Visit: Payer: Self-pay

## 2020-05-06 ENCOUNTER — Ambulatory Visit (INDEPENDENT_AMBULATORY_CARE_PROVIDER_SITE_OTHER): Payer: No Payment, Other | Admitting: Psychiatry

## 2020-05-06 ENCOUNTER — Encounter (HOSPITAL_COMMUNITY): Payer: Self-pay | Admitting: Psychiatry

## 2020-05-06 DIAGNOSIS — F319 Bipolar disorder, unspecified: Secondary | ICD-10-CM

## 2020-05-06 DIAGNOSIS — F4323 Adjustment disorder with mixed anxiety and depressed mood: Secondary | ICD-10-CM

## 2020-05-06 MED ORDER — GABAPENTIN 300 MG PO CAPS: 300.0000 mg | ORAL_CAPSULE | ORAL | 2 refills | Status: DC

## 2020-05-06 MED ORDER — SERTRALINE HCL 50 MG PO TABS: 50.0000 mg | ORAL_TABLET | Freq: Every day | ORAL | 2 refills | Status: DC

## 2020-05-06 MED ORDER — OLANZAPINE 10 MG PO TABS: 10.0000 mg | ORAL_TABLET | Freq: Every day | ORAL | 2 refills | Status: DC

## 2020-05-06 MED ORDER — HYDROXYZINE HCL 25 MG PO TABS: 25.0000 mg | ORAL_TABLET | Freq: Three times a day (TID) | ORAL | 2 refills | Status: DC | PRN
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
## 2020-05-24 ENCOUNTER — Ambulatory Visit (HOSPITAL_COMMUNITY): Payer: No Payment, Other | Admitting: Licensed Clinical Social Worker

## 2020-05-31 IMAGING — US US PELVIS COMPLETE TRANSABD/TRANSVAG
1 series · 15 of 25 positions shown · non-contrast
Comparison: None

CLINICAL DATA: Abnormal uterine bleeding

EXAM:
TRANSABDOMINAL AND TRANSVAGINAL ULTRASOUND OF PELVIS
TECHNIQUE: Both transabdominal and transvaginal ultrasound examinations of the
pelvis were performed. Transabdominal technique was performed for
global imaging of the pelvis including uterus, ovaries, adnexal
regions, and pelvic cul-de-sac. It was necessary to proceed with
endovaginal exam following the transabdominal exam to visualize the
adnexa and RIGHT ovary.

[Series 1: us pelvis complete transabd/transvag · 15 of 72 slices shown]
[im 1/72]
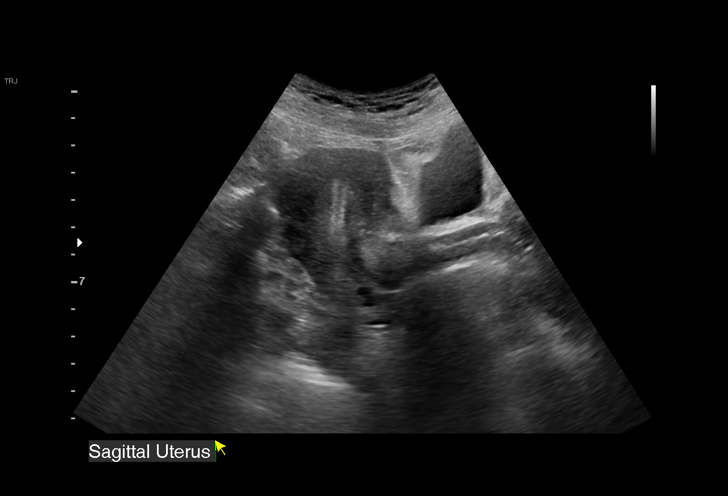
[im 6/72]
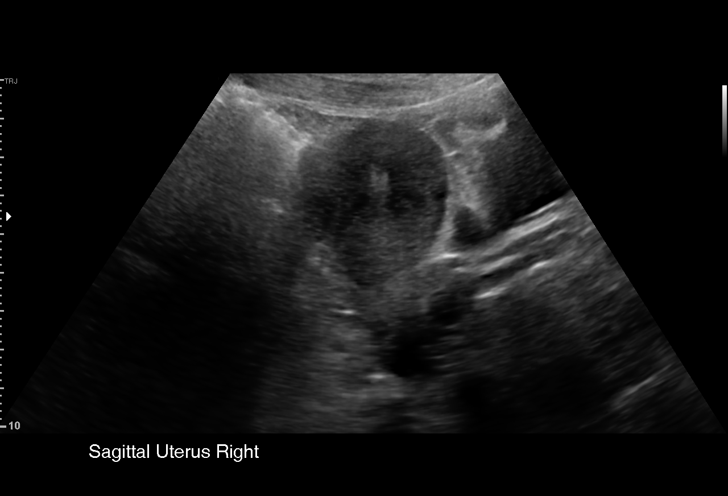
[im 12/72]
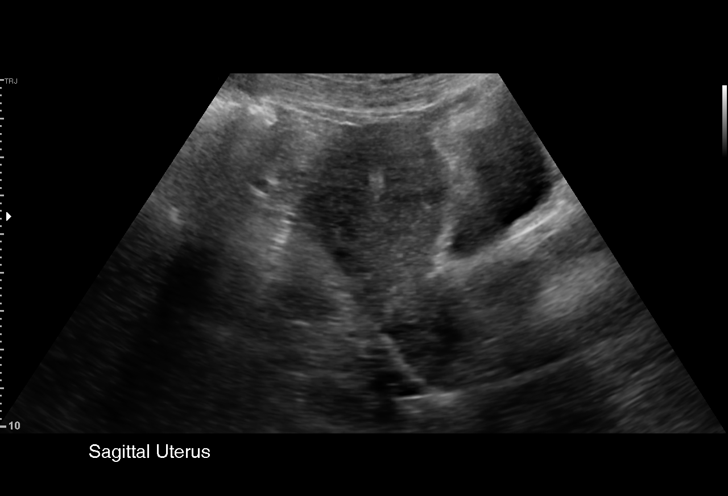
[im 15/72]
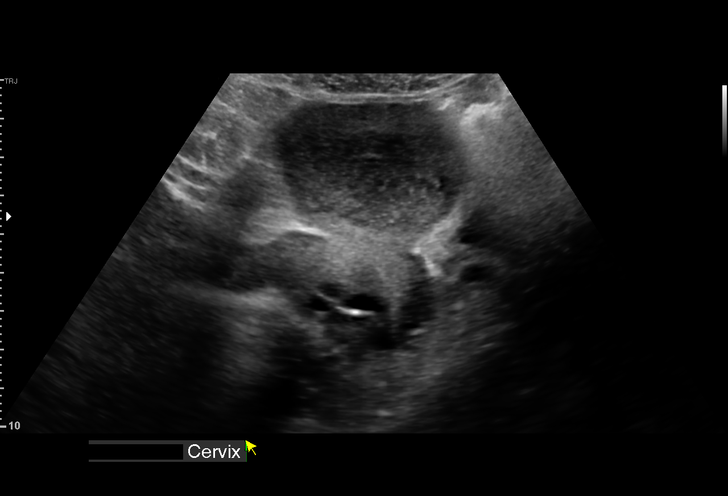
[im 21/72]
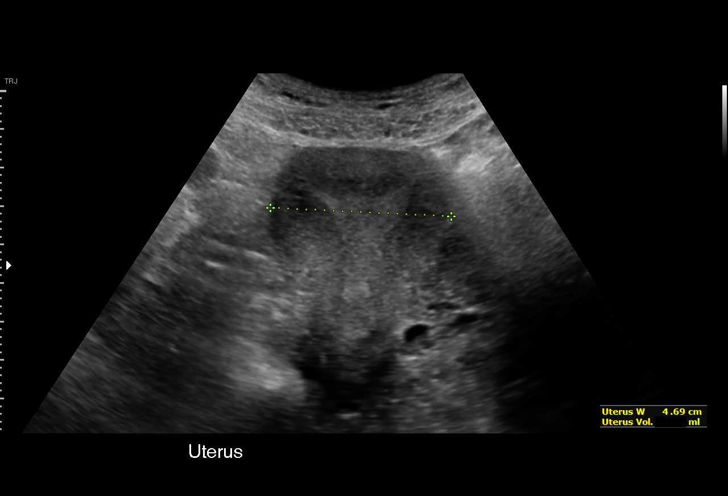
[im 27/72]
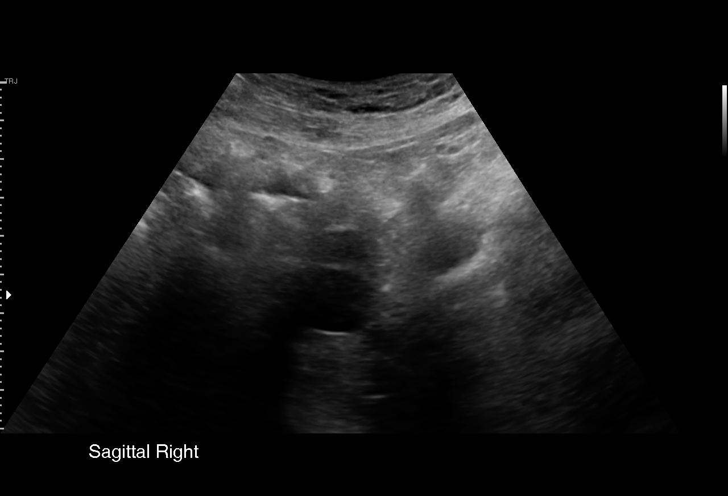
[im 30/72]
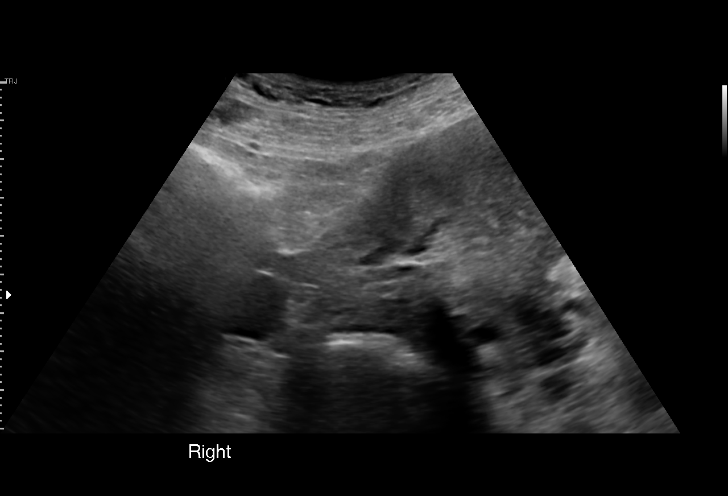
[im 36/72]
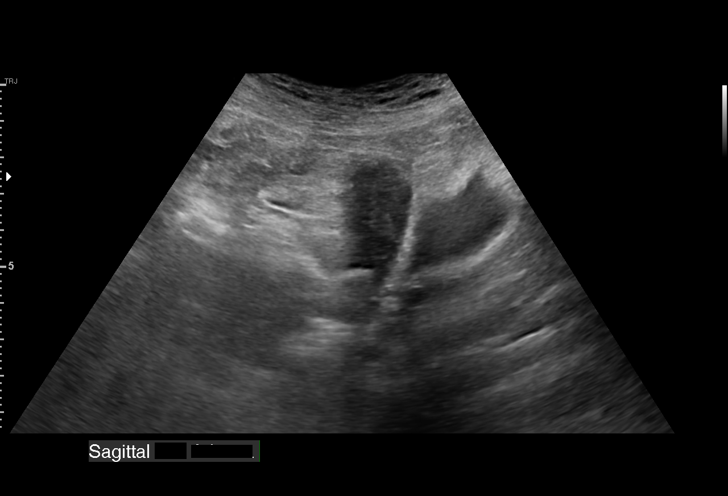
[im 42/72]
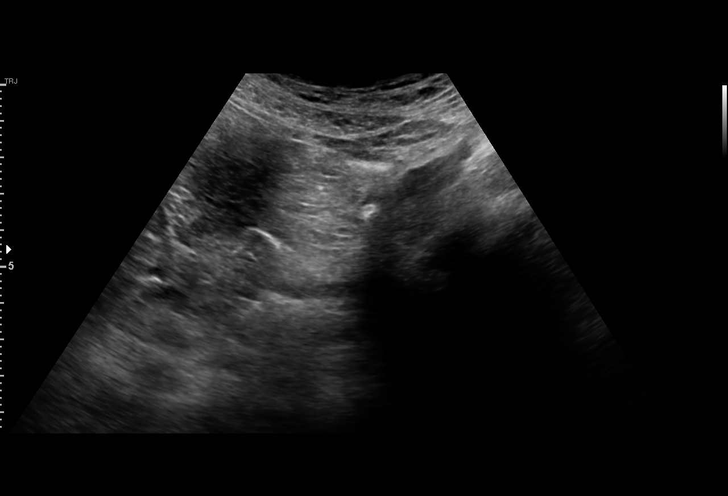
[im 45/72]
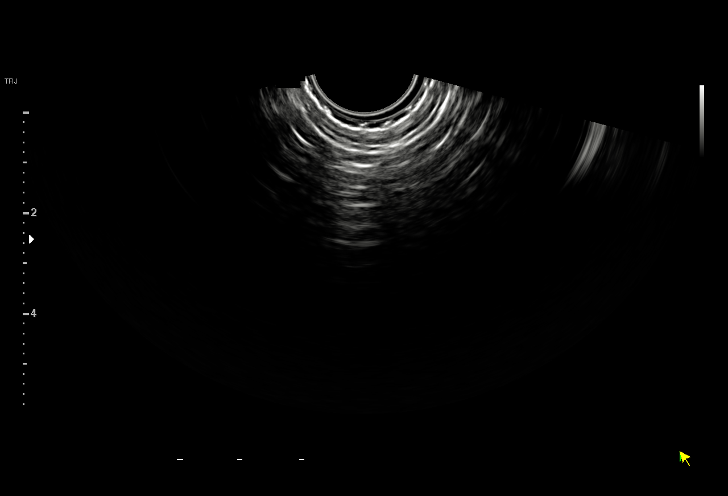
[im 51/72]
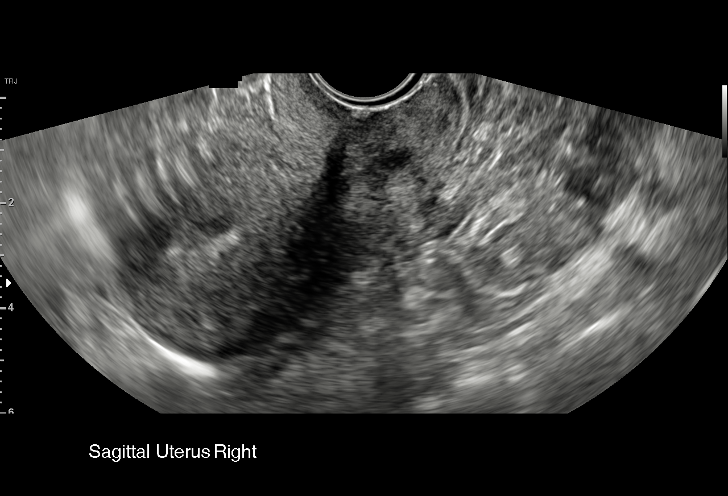
[im 57/72]
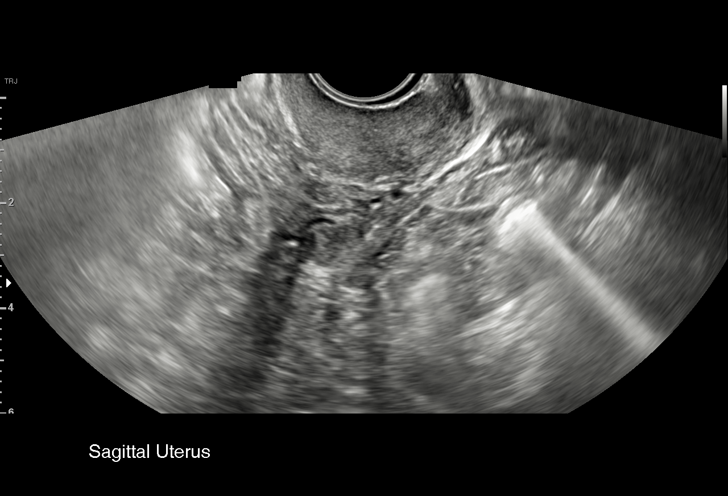
[im 60/72]
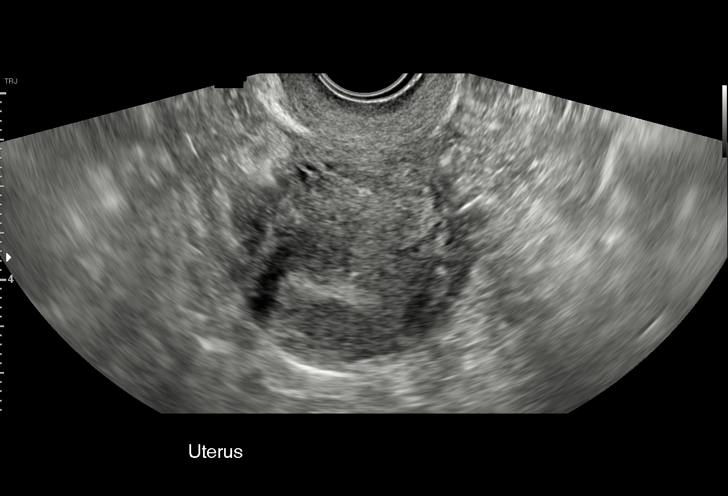
[im 66/72]
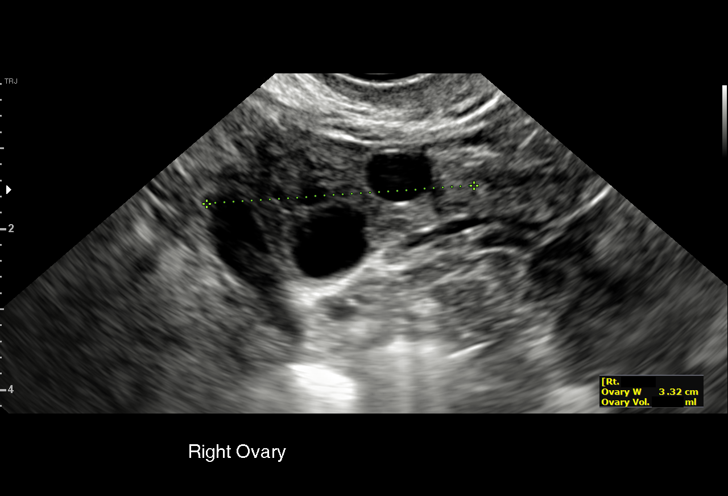
[im 72/72]
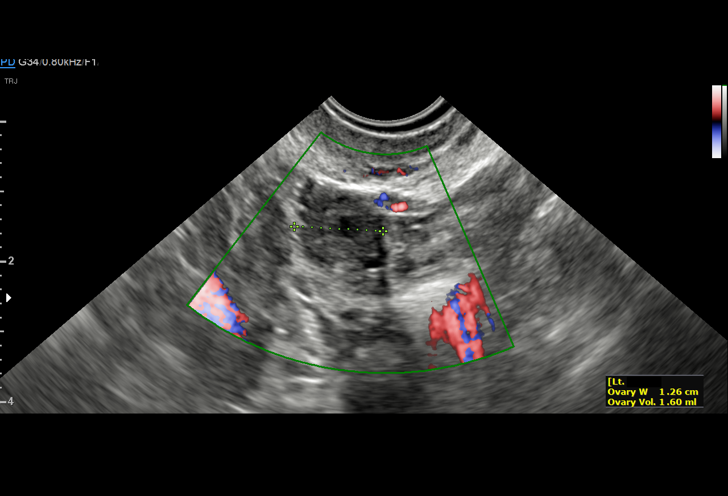

[15 of 25 positions shown; findings below may reference images not displayed]

FINDINGS: Uterus

Measurements: 7.6 x 3.7 x 4.8 cm = volume: 69.6 mL. Anteflexed.
Normal morphology without mass.

Endometrium

Thickness: 6 mm.  No endometrial fluid or focal abnormality

Right ovary

Measurements: 3.2 x 2.2 x 2.8 cm = volume: 10.2 mL. Dominant
follicle 2.0 cm diameter. No additional mass.

Left ovary

Measurements: 2.7 x 1.4 x 1.8 cm = volume: 3.4 mL. Normal morphology
without mass

Other findings

No free pelvic fluid or adnexal masses.
IMPRESSION: No pelvic sonographic abnormalities identified.

## 2020-06-18 ENCOUNTER — Ambulatory Visit (HOSPITAL_COMMUNITY): Payer: No Payment, Other | Admitting: Licensed Clinical Social Worker

## 2020-06-18 ENCOUNTER — Telehealth (HOSPITAL_COMMUNITY): Payer: Self-pay | Admitting: Licensed Clinical Social Worker

## 2020-06-18 NOTE — Telephone Encounter (Signed)
Pt scheduled for 10a appt but failed to come for the second time, which is very unusal. LCSW called pt who answers. States she did not know she had an appt today. She is apologetic. Pt states she is having trouble with my chart and could not see when appts are.  She reports she is doing okay and would like to get back on schedule for counseling. LCSW advises of pt's next med management appt and told her of plan to request she be added back to LCSW's schedule for next avail appt. Scheduler will call to inform pt of date. Pt states appreciation for call.

## 2020-07-04 ENCOUNTER — Encounter (HOSPITAL_COMMUNITY): Payer: Self-pay | Admitting: Psychiatry

## 2020-07-04 ENCOUNTER — Ambulatory Visit (INDEPENDENT_AMBULATORY_CARE_PROVIDER_SITE_OTHER): Payer: No Payment, Other | Admitting: Psychiatry

## 2020-07-04 ENCOUNTER — Other Ambulatory Visit: Payer: Self-pay

## 2020-07-04 VITALS — BP 153/99 | HR 80 | Ht 66.0 in | Wt 172.0 lb

## 2020-07-04 DIAGNOSIS — F319 Bipolar disorder, unspecified: Secondary | ICD-10-CM

## 2020-07-04 DIAGNOSIS — F411 Generalized anxiety disorder: Secondary | ICD-10-CM

## 2020-07-04 MED ORDER — SERTRALINE HCL 100 MG PO TABS: 100.0000 mg | ORAL_TABLET | Freq: Every day | ORAL | 2 refills | Status: DC

## 2020-07-04 MED ORDER — GABAPENTIN 300 MG PO CAPS: 300.0000 mg | ORAL_CAPSULE | ORAL | 2 refills | Status: DC

## 2020-07-04 MED ORDER — OLANZAPINE 10 MG PO TABS: 10.0000 mg | ORAL_TABLET | Freq: Every day | ORAL | 2 refills | Status: DC
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
## 2020-08-09 ENCOUNTER — Ambulatory Visit (INDEPENDENT_AMBULATORY_CARE_PROVIDER_SITE_OTHER): Payer: No Payment, Other | Admitting: Licensed Clinical Social Worker

## 2020-08-09 DIAGNOSIS — F418 Other specified anxiety disorders: Secondary | ICD-10-CM | POA: Diagnosis not present

## 2020-08-14 NOTE — Progress Notes (Signed)
   THERAPIST PROGRESS NOTE  Session Time: 35 min  Participation Level: Active  Behavioral Response: CasualAlertAnxious  Type of Therapy: Individual Therapy  Treatment Goals addressed: Communication: Anx/dep/coping  Interventions: Supportive  Summary: [name] is a 45 y.o. female who presents with hx of anx/dep. This date pt returns for in person session. Pt last seen 05/03/20. Pt reports after she lost her job she lost track of a number of things while trying to get unemployment. Pt states she has finally been approved for 11 wks and has just been receiving ~2 wks. Pt reports she has been coping adequately, sleeping well. She states her Zyprexa was increased and she is taking all meds as prescribed. Pt denies any irritable outbursts/yelling. She states she is getting along with Eunice Blase and Onalee Hua. Reports son, [name], is "good". She states he will be going to [employer] when school concludes and enjoys it there. States he is doing well in school and loves math. He is reportedly getting ready to start to lose his teeth and pt will be doing the tooth fairy saying "I believe in doing all those kinds of things". Pt states concern son does not understand that she cannot afford the same things Onalee Hua does for her son. She states he was disrespectful to her in grocery store recently when he wanted a toy. She further worries how much worse it will be when something happens to Onalee Hua since Onalee Hua buys so much for [place]. LCSW assisted to problem solve. Pt will start to do shopping exercises with play money at home with son. Pt needs more guidance r/t appropriate discipline. Educated on restrictions of items important to him such as tablet when he misbehaves. Pt advises her sil who is pregnant is being induced today and she is looking forward to being involved in new baby's life as an aunt. Pt denies other worries/concerns. LCSW reviewed poc including scheduling prior to close of session. Pt states  appreciation for care.     Suicidal/Homicidal: Nowithout intent/plan  Therapist Response: Pt wishes to return to regular sessions.  Plan: Return again for next avail appt.  Diagnosis: Axis I: Anxiety with depression  [place] Sink, LCSW 08/14/2020

## 2020-08-23 ENCOUNTER — Other Ambulatory Visit: Payer: Self-pay

## 2020-08-23 ENCOUNTER — Ambulatory Visit (INDEPENDENT_AMBULATORY_CARE_PROVIDER_SITE_OTHER): Payer: No Payment, Other | Admitting: Licensed Clinical Social Worker

## 2020-08-23 DIAGNOSIS — F418 Other specified anxiety disorders: Secondary | ICD-10-CM

## 2020-08-31 ENCOUNTER — Other Ambulatory Visit: Payer: Self-pay

## 2020-08-31 ENCOUNTER — Emergency Department (HOSPITAL_COMMUNITY)
Admission: EM | Admit: 2020-08-31 | Discharge: 2020-08-31 | Disposition: A | Discharge status: Home / Self Care | Payer: Self-pay | Attending: Emergency Medicine | Admitting: Emergency Medicine

## 2020-08-31 DIAGNOSIS — Z91040 Latex allergy status: Secondary | ICD-10-CM | POA: Insufficient documentation

## 2020-08-31 DIAGNOSIS — F17200 Nicotine dependence, unspecified, uncomplicated: Secondary | ICD-10-CM | POA: Insufficient documentation

## 2020-08-31 DIAGNOSIS — I10 Essential (primary) hypertension: Secondary | ICD-10-CM | POA: Insufficient documentation

## 2020-08-31 DIAGNOSIS — B0239 Other herpes zoster eye disease: Secondary | ICD-10-CM | POA: Insufficient documentation

## 2020-08-31 DIAGNOSIS — B0230 Zoster ocular disease, unspecified: Secondary | ICD-10-CM

## 2020-08-31 LAB — CBC WITH DIFFERENTIAL/PLATELET
Abs Immature Granulocytes: 0.05 10*3/uL (ref 0.00–0.07)
Basophils Absolute: 0 10*3/uL (ref 0.0–0.1)
Basophils Relative: 1 %
Eosinophils Absolute: 0.1 10*3/uL (ref 0.0–0.5)
Eosinophils Relative: 1 %
HCT: 44 % (ref 36.0–46.0)
Hemoglobin: 14.6 g/dL (ref 12.0–15.0)
Immature Granulocytes: 1 %
Lymphocytes Relative: 29 %
Lymphs Abs: 1.9 10*3/uL (ref 0.7–4.0)
MCH: 29.6 pg (ref 26.0–34.0)
MCHC: 33.2 g/dL (ref 30.0–36.0)
MCV: 89.2 fL (ref 80.0–100.0)
Monocytes Absolute: 0.4 10*3/uL (ref 0.1–1.0)
Monocytes Relative: 7 %
Neutro Abs: 4.1 10*3/uL (ref 1.7–7.7)
Neutrophils Relative %: 61 %
Platelets: 349 10*3/uL (ref 150–400)
RBC: 4.93 MIL/uL (ref 3.87–5.11)
RDW: 14.7 % (ref 11.5–15.5)
WBC: 6.6 10*3/uL (ref 4.0–10.5)
nRBC: 0 % (ref 0.0–0.2)

## 2020-08-31 LAB — CBG MONITORING, ED: Glucose-Capillary: 118 mg/dL — ABNORMAL HIGH (ref 70–99)

## 2020-08-31 LAB — BASIC METABOLIC PANEL
Anion gap: 7 (ref 5–15)
BUN: 12 mg/dL (ref 6–20)
CO2: 25 mmol/L (ref 22–32)
Calcium: 9.1 mg/dL (ref 8.9–10.3)
Chloride: 104 mmol/L (ref 98–111)
Creatinine, Ser: 0.91 mg/dL (ref 0.44–1.00)
GFR, Estimated: >60 mL/min (ref >60)
Glucose, Bld: 96 mg/dL (ref 70–99)
Potassium: 3.9 mmol/L (ref 3.5–5.1)
Sodium: 136 mmol/L (ref 135–145)

## 2020-08-31 LAB — SEDIMENTATION RATE: Sed Rate: 15 mm/hr (ref 0–22)

## 2020-08-31 LAB — C-REACTIVE PROTEIN: CRP: 0.6 mg/dL (ref <1.0)

## 2020-08-31 MED ORDER — VALACYCLOVIR HCL 500 MG PO TABS
1000.0000 mg | ORAL_TABLET | Freq: Once | ORAL | Status: AC
  Administered 2020-08-31: 1000 mg via ORAL
  Filled 2020-08-31: qty 2

## 2020-08-31 MED ORDER — VALACYCLOVIR HCL 1 G PO TABS: 1000.0000 mg | ORAL_TABLET | Freq: Three times a day (TID) | ORAL | 0 refills | Status: AC

## 2020-08-31 MED ORDER — FLUORESCEIN SODIUM 1 MG OP STRP
1.0000 | ORAL_STRIP | Freq: Once | OPHTHALMIC | Status: AC
  Administered 2020-08-31: 1 via OPHTHALMIC
  Filled 2020-08-31: qty 1

## 2020-08-31 MED ORDER — TETRACAINE HCL 0.5 % OP SOLN
1.0000 [drp] | Freq: Once | OPHTHALMIC | Status: AC
  Administered 2020-08-31: 1 [drp] via OPHTHALMIC
  Filled 2020-08-31: qty 4

## 2020-08-31 NOTE — Discharge Instructions (Addendum)
You were seen in the ER today for your your scalp pain and eye changes. You were found to have herpes zoster ophthalmicus, which is shingles that involves the area around your eye.  You have been started on an antiviral medication called valacyclovir which you should take 3 times daily for the next 10 days as prescribed.  Below is the contact information for Dr. Jenene Slicker, the ophthalmologist, please practice she is to call first thing Monday morning to schedule follow-up appointment this week.   Return to the emergency department if you develop any new or worsening eye pain, blurry vision, double vision, loss of vision, or any other new severe symptoms.  Additionally please do not be around any infants or any elderly individuals until you have completed your antiviral therapy.

## 2020-08-31 NOTE — ED Provider Notes (Signed)
[place] DEPT Provider Note   CSN: [id number] Arrival date & time: 08/31/20  3710     History Chief Complaint  Patient presents with  . Headache  . Eye Problem    [name] is a 45 y.o. female who presents with concern for burning pain in the left scalp, around the left eye, now with left eye drooping and mildly blurred vision x6 days.  Does endorse worsening pain in the left eye when she looks around the room.  Unable to open eye completely.  She denies any other rash, or states she is not able to close into her hairline.  Denies any change in her hearing or pain in the ear. Patient endorses some "fogginess" in her vision when she looks downward from the left eye, however feel this is more secondary to the droopy lid with obstruction of her visual field rather than true blurriness  I personally reviewed the patient medical record she has history of bipolar, hypertension but is not on any hypertensive medication.  Additionally, patient smokes 2 packs of cigarettes a day.  Is very anxious.    HPI     Past Medical History:  Diagnosis Date  . Bipolar 1 disorder ([place])   . History of miscarriage   . Hypertension     Patient Active Problem List   Diagnosis Date Noted  . Generalized anxiety disorder 07/04/2020  . Adjustment disorder with mixed anxiety and depressed mood 11/07/2019  . Abnormal uterine bleeding (AUB) 12/19/2017  . UTI (urinary tract infection) 12/19/2017  . Latex allergy 04/11/2014  . Allergy to penicillin 04/11/2014  . Alcohol use 04/11/2014  . Bipolar 1 disorder ([place]) 10/02/2013  . HTN (hypertension)--no recent/current meds. 10/02/2013  . Smoker 10/02/2013    Past Surgical History:  Procedure Laterality Date  . NO PAST SURGERIES    . TUBAL LIGATION N/A 04/13/2014   Procedure: POST PARTUM TUBAL LIGATION;  Surgeon: Crawford Givens, MD;  Location: [facility];  Service: Gynecology;  Laterality: N/A;     OB History    Gravida   7   Para  3   Term  0   Preterm  1   AB  4   Living  3     SAB  3   IAB      Ectopic  1   Multiple  0   Live Births  3           Family History  Problem Relation Age of Onset  . Cancer Mother 66       breast  . Cancer Maternal Grandmother 38       breast  . Cancer Maternal Grandfather        stomach  . Hypertension Father   . Heart disease Father     Social History   Tobacco Use  . Smoking status: Current Every Day Smoker    Packs/day: 0.50  . Smokeless tobacco: Never Used  Vaping Use  . Vaping Use: Never used  Substance Use Topics  . Alcohol use: Yes    Alcohol/week: 24.0 standard drinks    Types: 24 Cans of beer per week  . Drug use: Yes    Types: Marijuana    Home Medications Prior to Admission medications   Medication Sig Start Date End Date Taking? Authorizing Provider  valACYclovir (VALTREX) 1000 MG tablet Take 1 tablet (1,000 mg total) by mouth 3 (three) times daily for 10 days. 08/31/20 09/10/20 Yes Ahniya Mitchum, Gypsy Balsam,  PA-C  gabapentin (NEURONTIN) 300 MG capsule Take 1-2 capsules (300-600 mg total) by mouth See admin instructions. Take 300 mg every morning and 600 mg every night at bedtime. 07/04/20   Salley Slaughter, NP  ibuprofen (ADVIL,MOTRIN) 200 MG tablet Take 200 mg by mouth every 6 (six) hours as needed for mild pain or moderate pain.    [provider]  OLANZapine (ZYPREXA) 10 MG tablet Take 1 tablet (10 mg total) by mouth at bedtime. 07/04/20   Salley Slaughter, NP  omeprazole (PRILOSEC) 20 MG capsule Take 20 mg by mouth daily.    [provider]  sertraline (ZOLOFT) 100 MG tablet Take 1 tablet (100 mg total) by mouth daily. 07/04/20   Salley Slaughter, NP    Allergies    Latex and Penicillins  Review of Systems   Review of Systems  Constitutional: Negative.   HENT: Positive for facial swelling. Negative for congestion, ear discharge, ear pain, hearing loss, sneezing, sore throat, tinnitus, trouble  swallowing and voice change.   Eyes: Positive for photophobia, pain and visual disturbance. Negative for redness.  Respiratory: Negative.   Cardiovascular: Negative.   Gastrointestinal: Negative.   Genitourinary: Negative.   Musculoskeletal: Negative.   Skin: Negative.        Burning pain to the left scalp  Neurological: Negative for headaches.    Physical Exam Updated Vital Signs BP (!) 151/109   Pulse 60   Temp 98.1 F (36.7 C) (Oral)   Resp 18   Ht _0  (1.676 m)   Wt 80.3 kg   SpO2 98%   BMI 28.57 kg/m   Physical Exam Vitals and nursing note reviewed.  Constitutional:      Appearance: She is not ill-appearing or toxic-appearing.  HENT:     Head: Normocephalic and atraumatic.      Nose: Nose normal.     Mouth/Throat:     Mouth: Mucous membranes are moist.     Pharynx: Oropharynx is clear. Uvula midline. No oropharyngeal exudate, posterior oropharyngeal erythema or uvula swelling.     Tonsils: No tonsillar exudate.  Eyes:     General: Lids are normal. Vision grossly intact.        Right eye: No discharge.        Left eye: No discharge.     Extraocular Movements: Extraocular movements intact.     Conjunctiva/sclera: Conjunctivae normal.     Pupils: Pupils are unequal.     Right eye: Pupil is round, reactive and not sluggish. No corneal abrasion or fluorescein uptake. Seidel exam negative.     Left eye: Pupil is round, reactive and not sluggish. No corneal abrasion or fluorescein uptake. Seidel exam negative.    Slit lamp exam:    Right eye: Anterior chamber quiet.     Left eye: Anterior chamber quiet. Photophobia present.      Comments: Drooping and swelling of the left upper eyelid, anisocoria right pupil 6 millimeters in diameter, left pupil 4 mm.  Pain elicited in left eye with EOMs, no EOMs are intact. No herpetic lesions in the left eye on slit-lamp exam.    Neck:     Trachea: Trachea and phonation normal.  Cardiovascular:     Rate and Rhythm: Normal  rate and regular rhythm.     Pulses: Normal pulses.     Heart sounds: Normal heart sounds. No murmur heard.   Pulmonary:     Effort: Pulmonary effort is normal. No respiratory distress.  Breath sounds: Normal breath sounds. No wheezing or rales.  Abdominal:     General: Bowel sounds are normal. There is no distension.     Tenderness: There is no abdominal tenderness.  Musculoskeletal:        General: No deformity.     Cervical back: Normal range of motion and neck supple. No edema, rigidity or crepitus. No pain with movement, spinous process tenderness or muscular tenderness.  Lymphadenopathy:     Cervical: No cervical adenopathy.  Skin:    General: Skin is warm and dry.  Neurological:     Mental Status: She is alert. Mental status is at baseline.  Psychiatric:        Mood and Affect: Mood normal.     ED Results / Procedures / Treatments   Labs (all labs ordered are listed, but only abnormal results are displayed) Labs Reviewed  CBG MONITORING, ED - Abnormal; Notable for the following components:      Result Value   Glucose-Capillary 118 (*)    All other components within normal limits  BASIC METABOLIC PANEL  CBC WITH DIFFERENTIAL/PLATELET  SEDIMENTATION RATE  C-REACTIVE PROTEIN    EKG None  Radiology No results found.  Procedures Procedures   Medications Ordered in ED Medications  fluorescein ophthalmic strip 1 strip (1 strip Left Eye Given 08/31/20 1306)  tetracaine (PONTOCAINE) 0.5 % ophthalmic solution 1 drop (1 drop Left Eye Given 08/31/20 1306)  valACYclovir (VALTREX) tablet 1,000 mg (1,000 mg Oral Given 08/31/20 1451)    ED Course  I have reviewed the triage vital signs and the nursing notes.  Pertinent labs & imaging results that were available during my care of the patient were reviewed by me and considered in my medical decision making (see chart for details).    MDM Rules/Calculators/A&P                         45 year old female who presents  with concern for burning skin to the left scalp, drooping and edema of the left eyelid, and pain in the left eye.  Differential diagnosis includes but is not limited to herpes zoster folic acid, herpes simplex keratitis, Conjunctivitis, gonococcal conjunctivitis, allergic conjunctivitis, caustic conjunctivitis, Ramsay Hunt syndrome.  Hypertensive on intake and vital signs otherwise normal.  Cardiopulmonary exam is required.  EGD exam significant only for healing lesions in the left scalp, edema and drooping of the left upper eyelid, 2 small vesicular lesions on the left lower eyelid and one on the left upper eyelid.  Addition there is an excoriated with right pupil greater than left by 2 mm.  Mild pain in the left eye elicited with EOMs, however no other skin changes around the eye to suggest cellulitis. Exam without any herpetic lesions  CBC unremarkable, BMP unremarkable, CRP and ESR normal.  Discussed with ophthalmologist, Dr. Stacie Glaze, who recommends proceeding with Valtrex 1 g 3 times daily x10 to 14 days as well as artificial tears.  She recommends patient follow-up with [facility] in the next week.  No further work-up warranted in the ED as time.  [name] voiced understanding of her medical evaluation and treatment plan.  Each of her questions was answered to her expressed satisfaction.  Isolation cautions for infants in elderly patients was advised, strict return precautions given.  Patient is well-appearing, stable, and appropriate for discharge at this time.  This chart was dictated using voice recognition software, Dragon. Despite the best efforts of this provider  to proofread and correct errors, errors may still occur which can change documentation meaning.  Final Clinical Impression(s) / ED Diagnoses Final diagnoses:  Herpes zoster ophthalmicus of left eye    Rx / DC Orders ED Discharge Orders         Ordered    valACYclovir (VALTREX) 1000 MG tablet  3 times daily         08/31/20 1541           Rayvn Rickerson, Gypsy Balsam, PA-C 08/31/20 1554    Carmin Muskrat, MD 09/01/20 1505

## 2020-08-31 NOTE — ED Triage Notes (Signed)
Patient reports left sided headache, extending behind her eye. Pain is rated   . Patient says her left eye started drooping. Patient says vision is blurry when she looks down. Patient bp is 185/114 in triage, patient endorses htn but says she doesn't take medications due to no health insurance. cbg in triage is 118

## 2020-09-02 NOTE — Progress Notes (Signed)
   THERAPIST PROGRESS NOTE  Virtual Visit via Video Note  I connected with [name] on 08/23/20 at 10:00 AM EDT by a video enabled telemedicine application and verified that I am speaking with the correct person using two identifiers.  Location: Patient: Home Provider: [facility]   I discussed the limitations of evaluation and management by telemedicine and the availability of in person appointments. The patient expressed understanding and agreed to proceed. I discussed the assessment and treatment plan with the patient. The patient was provided an opportunity to ask questions and all were answered. The patient agreed with the plan and demonstrated an understanding of the instructions.  I provided 35 minutes of non-face-to-face time during this encounter.  Participation Level: Active  Behavioral Response: CasualAlertmostly positive with mild anx  Type of Therapy: Individual Therapy  Treatment Goals addressed: Communication: anx/dep/coping  Interventions: Supportive  Summary: [name] is a 45 y.o. female who presents with hx of anx/dep. This date pt signs on for video session. Pt states she has a new phone and connection does work extremely well throughout session. Pt is at home and admits her roommate [name] is in the room. She wishes to proceed with session openly. Pt reports she is doing "pretty good" when asked. She is taking meds as prescribed and feels her moods are mostly well regulated. She states she is continuing to have some unusual dreams. Pt reports updates on son and relationship with [name]. LCSW provided education on elements of communication to stay mindful about. [place] trip finalized for July 15- 18, [employer]. Most of session spent addressing her search for work. Pt reports on interviews and applications. 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 Pt states "I'm excited". LCSW assessed for  status of new niece. Pt provides details on the arrival of [name] stating she got to meet the baby last Sat. Pt also enthusiastic about being an aunt. Pt denies other worries/concerns. LCSW reviewed coping and poc prior to close of session. Pt states appreciation for care.  Suicidal/Homicidal: Nowithout intent/plan  Therapist Response:  Pt remains receptive to care.  Plan: Return again in ~2 weeks.  Diagnosis: Axis I: Anxiety with depression  [place] Sink, LCSW 09/02/2020

## 2020-09-06 ENCOUNTER — Telehealth (HOSPITAL_COMMUNITY): Payer: Self-pay | Admitting: Licensed Clinical Social Worker

## 2020-09-06 ENCOUNTER — Ambulatory Visit (HOSPITAL_COMMUNITY): Payer: No Payment, Other | Admitting: Licensed Clinical Social Worker

## 2020-09-06 NOTE — Telephone Encounter (Signed)
LCSW sent text link for virtual session per schedule. When pt failed to sign on LCSW called pt. Call went to vm. Left detailed message re purpose of call.

## 2020-10-03 ENCOUNTER — Ambulatory Visit (HOSPITAL_COMMUNITY): Payer: No Payment, Other | Admitting: Psychiatry

## 2020-10-16 ENCOUNTER — Other Ambulatory Visit (HOSPITAL_COMMUNITY): Payer: Self-pay | Admitting: Psychiatry

## 2020-10-16 ENCOUNTER — Telehealth (HOSPITAL_COMMUNITY): Payer: Self-pay | Admitting: Psychiatry

## 2020-10-16 DIAGNOSIS — F319 Bipolar disorder, unspecified: Secondary | ICD-10-CM

## 2020-10-16 DIAGNOSIS — F411 Generalized anxiety disorder: Secondary | ICD-10-CM

## 2020-10-16 MED ORDER — GABAPENTIN 300 MG PO CAPS: 300.0000 mg | ORAL_CAPSULE | ORAL | 2 refills | Status: DC

## 2020-10-16 MED ORDER — OLANZAPINE 10 MG PO TABS: 10.0000 mg | ORAL_TABLET | Freq: Every day | ORAL | 2 refills | Status: DC

## 2020-10-16 MED ORDER — SERTRALINE HCL 100 MG PO TABS: 100.0000 mg | ORAL_TABLET | Freq: Every day | ORAL | 2 refills | Status: DC

## 2020-10-16 NOTE — Telephone Encounter (Signed)
Medications refilled and sent to preferred pharmacy.

## 2020-10-16 NOTE — Telephone Encounter (Signed)
Patient called for REFILL:  ZOLOFT 100 MG & GABAPENTIN 300 MG & ZYPREXA 10 MG Pharmacy :  [facility] Patient phone number: [phone] Last seen:  07/04/20 COMMENTS:   next appt 12/06/20

## 2020-10-31 ENCOUNTER — Ambulatory Visit (HOSPITAL_COMMUNITY): Payer: No Payment, Other | Admitting: Licensed Clinical Social Worker

## 2020-11-04 ENCOUNTER — Ambulatory Visit (INDEPENDENT_AMBULATORY_CARE_PROVIDER_SITE_OTHER): Payer: No Payment, Other | Admitting: Licensed Clinical Social Worker

## 2020-11-04 ENCOUNTER — Other Ambulatory Visit: Payer: Self-pay

## 2020-11-04 DIAGNOSIS — F418 Other specified anxiety disorders: Secondary | ICD-10-CM

## 2020-11-05 NOTE — Progress Notes (Signed)
THERAPIST PROGRESS NOTE   Virtual Visit via Video Note  I connected with [name] on 11/04/20 at  3:00 PM EDT by a video enabled telemedicine application and verified that I am speaking with the correct person using two identifiers.  Location: Patient: Home Provider: Home   I discussed the limitations of evaluation and management by telemedicine and the availability of in person appointments. The patient expressed understanding and agreed to proceed. I discussed the assessment and treatment plan with the patient. The patient was provided an opportunity to ask questions and all were answered. The patient agreed with the plan and demonstrated an understanding of the instructions.  I provided 40 minutes of non-face-to-face time during this encounter.  Participation Level: Active  Behavioral Response: CasualAlertAnxious  Type of Therapy: Individual Therapy  Treatment Goals addressed: Anxiety and Coping  Interventions: Solution Focused and Supportive  Summary: [name] is a 45 y.o. female who presents with hx of anx/dep. This date pt signs on for video session. She is out in the yard with her 45 yr old. Asked pt to move to a private location. Pt goes inside into one of the bedrooms in the house and closes the door. At this point there begins to be intermittent connection problems and session ultimately completed via phone when tech difficulties continued. Pt reports she has started a new job with [employer], which began in June. 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 She states she is only getting 3 days per wk from 8a-1p at present and also states she does not believe she could tolerate more at this time despite much stress over financial strain. Pt is again off meds. She states she has been unable to afford them and also admits she missed last med management appt d/t new job. LCSW encouraged pt to always call when there is a conflict to avoid no  shows. Pt verbalizes understanding. Pt states she did call to ask for refill and says she did not hear back. LCSW accesses notes and informs pt per notes the prescriptions were called into pharmacy and should be there. Pt states she will not have the money to pick up until Friday but will call post session to be sure they are avail. She uses [place] and will also ask if she can pick up meds today but pay Friday. Again encouraged pt to make meds a priority in budget. Pt states she has a court date the 19th of this month r/t her nonpayment of child support for 42 yr old after she lost her job. She states she has to come up with $260 prior to court and is hopeful she will be able to. This is a source of anx.  Pt advises she is getting ready for son to go back to school and he is excited about return, has open house this Thurs. She states she and son had a very good trip to the beach with [place] and reports she and Onalee Hua are getting along well at present. Pt states she did lose her paternal grandmother and maternal step grandmother since last session. She informs she is coping adequately as she did not have a close relationship with either. States she may get an inheritance of some kind from one of them. Pt states what a relief this would be if that were to happen since she is struggling so much financially. LCSW provided active listening of all pt concerns with assist  to process thoughts/feelings. LCSW reviewed poc including scheduling prior to close of session. Pt states appreciation for care.    Suicidal/Homicidal: Nowithout intent/plan  Therapist Response: Pt remains receptive to care.  Plan: Return again for next avail appt.  Diagnosis: Axis I:  Anxiety with depression     [place] Sink, LCSW 11/05/2020

## 2020-12-06 ENCOUNTER — Telehealth (HOSPITAL_COMMUNITY): Payer: No Payment, Other | Admitting: Psychiatry

## 2020-12-17 ENCOUNTER — Other Ambulatory Visit: Payer: Self-pay

## 2020-12-17 ENCOUNTER — Ambulatory Visit (INDEPENDENT_AMBULATORY_CARE_PROVIDER_SITE_OTHER): Payer: No Payment, Other | Admitting: Licensed Clinical Social Worker

## 2020-12-17 DIAGNOSIS — F411 Generalized anxiety disorder: Secondary | ICD-10-CM

## 2020-12-25 NOTE — Progress Notes (Signed)
   THERAPIST PROGRESS NOTE  Virtual Visit via Telephone Note  I connected with [name] on 12/17/20 at  3:00 PM EDT by telephone and verified that I am speaking with the correct person using two identifiers.  Location: Patient: home  Provider: [facility]   I discussed the limitations, risks, security and privacy concerns of performing an evaluation and management service by telephone and the availability of in person appointments. I also discussed with the patient that there may be a patient responsible charge related to this service. The patient expressed understanding and agreed to proceed. I discussed the assessment and treatment plan with the patient. The patient was provided an opportunity to ask questions and all were answered. The patient agreed with the plan and demonstrated an understanding of the instructions.   The patient was advised to call back or seek an in-person evaluation if the symptoms worsen or if the condition fails to improve as anticipated.  I provided 25 minutes of non-face-to-face time during this encounter.  Participation Level: Active  Behavioral Response:  UTAAlertEuthymic  Type of Therapy: Individual Therapy  Treatment Goals addressed: Anxiety and Coping  Interventions: CBT and Supportive  Summary: [name] is a 45 y.o. female who presents with hx of dep/anx. This date pt attempts to sign on for video session. She can see and hear clinician. LCSW sees only a shadow of pt and there is no audio. LCSW calls pt's phone. She states she is not sure what is happening with her phone/connection and wishes to have phone session. Pt is noted to be in an uplifted mood by tone of voice and statements made. Pt reports she is "doing good". She states she has some good news to report. Pt states her past boss from the [employer] on [address] called to see if she would come back to work for them. This will be the third time she has been asked to return. Pt states they  offered her more money than [employer] and better hours, Mon-Fri 8a-1:30p. She reports Onalee Hua willing to help her with daily transportation so she gave notice at [facility] and has been back at [place] ~3 wks. She admits to some initial general anx r/t changes and reentry but reports anx well managed now. Pt states "I'm happy". Reminded of deep breathing and positive self talk. Pt taking meds as prescribed. States she is sleeping "good" yet does say she is still having strange dreams. Pt advises she and her son are getting along well. She states just the two of them went to the [place] in town last weekend and what a good time they had. He is then heard crying. Pt states he is outside her window playing and she wants to end to go see what is wrong. Session ended quickly but did give pt next appt date. Pt states appreciation for care.     Suicidal/Homicidal: Nowithout intent/plan  Therapist Response: Pt remains receptive to care.  Plan: Return again in ~3 weeks.  Diagnosis: Axis I: Generalized Anxiety Disorder   [place] Sink, LCSW 12/25/2020

## 2021-01-09 ENCOUNTER — Ambulatory Visit (INDEPENDENT_AMBULATORY_CARE_PROVIDER_SITE_OTHER): Payer: No Payment, Other | Admitting: Licensed Clinical Social Worker

## 2021-01-09 DIAGNOSIS — F411 Generalized anxiety disorder: Secondary | ICD-10-CM
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
## 2021-01-27 ENCOUNTER — Encounter (HOSPITAL_COMMUNITY): Payer: Self-pay | Admitting: Psychiatry

## 2021-01-27 ENCOUNTER — Telehealth (INDEPENDENT_AMBULATORY_CARE_PROVIDER_SITE_OTHER): Payer: No Payment, Other | Admitting: Psychiatry

## 2021-01-27 DIAGNOSIS — F319 Bipolar disorder, unspecified: Secondary | ICD-10-CM

## 2021-01-27 DIAGNOSIS — F411 Generalized anxiety disorder: Secondary | ICD-10-CM

## 2021-01-27 MED ORDER — PROPRANOLOL HCL 10 MG PO TABS
10.0000 mg | ORAL_TABLET | Freq: Three times a day (TID) | ORAL | 3 refills | Status: DC
Start: 2021-01-27 — End: 2021-04-23

## 2021-01-27 MED ORDER — GABAPENTIN 300 MG PO CAPS: 300.0000 mg | ORAL_CAPSULE | ORAL | 2 refills | Status: DC

## 2021-01-27 MED ORDER — OLANZAPINE 15 MG PO TABS: 15.0000 mg | ORAL_TABLET | Freq: Every day | ORAL | 3 refills | Status: DC

## 2021-01-27 MED ORDER — SERTRALINE HCL 100 MG PO TABS: 100.0000 mg | ORAL_TABLET | Freq: Every day | ORAL | 3 refills | Status: DC

## 2021-01-27 NOTE — Progress Notes (Signed)
BH MD/PA/NP OP Progress Note Virtual Visit via Telephone Note  I connected with [name] on 01/27/21 at  3:30 PM EDT by telephone and verified that I am speaking with the correct person using two identifiers.  Location: Patient: home Provider: Clinic   I discussed the limitations, risks, security and privacy concerns of performing an evaluation and management service by telephone and the availability of in person appointments. I also discussed with the patient that there may be a patient responsible charge related to this service. The patient expressed understanding and agreed to proceed.   I provided 30 minutes of non-face-to-face time during this encounter.     01/27/2021 3:50 PM [name]  MRN:  [id number]  Chief Complaint: "I didn't feel like the increase in Zoloft was effective"    HPI: 45 year old female seen today follow-up psychiatric evaluation.  She has a psychiatric history of bipolar 1 disorder, anxiety, and depression.  She is currently being managed on Zyprexa 10 mg, Zoloft 100 mg, hydroxyzine 25 mg three times daily, gabapentin 300 mg am and gabapentin 600 HS.  She notes that her medications are some what effective in managing her psychiatric conditions.  Today she was unable to login for her first informed on the phone.  During exam she was pleasant, cooperative, and engaged in conversation. She informed Clinical research associate that recently she has been more irritable, distractible, impulsively spending, and having fluctuations in mood.  She also notes at times she is more anxious.  Provider conducted a GAD-7 and patient scored a 7, at her last visit she scored a 9.  Provider also conducted a PHQ-9 and patient scored a 6, at her last visit she scored a 7.  She notes that her sleep is adequate noting that she sleeps 9 hours nightly.  She also informed writer that her appetite is adequate.  Today she denies SI/HI/VAH or paranoia.    Patient notes that she is concerned about her  blood pressure.  She informed Clinical research associate that it continues to be elevated however reports that she does not have a primary care doctor.  Patient referred to community health and wellness.    Patient was recently she had shingles and reports that she was in pain.  She however notes that she has recuperated and is now feeling physically better.  She also notes that she has a new job at [employer] and is making more money  Today she is agreeable to starting propanolol 10 mg 3 times daily started to help manage anxiety and blood pressure.  She is also agreeable to increasing Zyprexa 10 mg to 15 mg to help manage mood. Patient will follow-up with community health and wellness for further management of blood pressure.  She will continue all other medications as prescribed.  Patient will follow-up with outpatient counseling for therapy.  No other concerns at this time.      Visit Diagnosis:    ICD-10-CM   1. Bipolar 1 disorder (HCC)  F31.9 gabapentin (NEURONTIN) 300 MG capsule    OLANZapine (ZYPREXA) 15 MG tablet    sertraline (ZOLOFT) 100 MG tablet    2. Generalized anxiety disorder  F41.1 propranolol (INDERAL) 10 MG tablet    sertraline (ZOLOFT) 100 MG tablet      Past Psychiatric History:  bipolar 1 disorder, anxiety, and depression.   Past Medical History:  Past Medical History:  Diagnosis Date   Bipolar 1 disorder (HCC)    History of miscarriage    Hypertension  Past Surgical History:  Procedure Laterality Date   NO PAST SURGERIES     TUBAL LIGATION N/A 04/13/2014   Procedure: POST PARTUM TUBAL LIGATION;  Surgeon: [name], MD;  Location: [facility];  Service: Gynecology;  Laterality: N/A;    Family Psychiatric History: Son Depression, Daughter ADHD and Depression, Father Bipolar disorder  Family History:  Family History  Problem Relation Age of Onset   Cancer Mother 63       breast   Cancer Maternal Grandmother 45       breast   Cancer Maternal Grandfather        stomach    Hypertension Father    Heart disease Father     Social History:  Social History   Socioeconomic History   Marital status: Divorced    Spouse name: Not on file   Number of children: Not on file   Years of education: Not on file   Highest education level: Not on file  Occupational History   Not on file  Tobacco Use   Smoking status: Every Day    Packs/day: 0.50    Types: Cigarettes   Smokeless tobacco: Never  Vaping Use   Vaping Use: Never used  Substance and Sexual Activity   Alcohol use: Yes    Alcohol/week: 24.0 standard drinks    Types: 24 Cans of beer per week   Drug use: Yes    Types: Marijuana   Sexual activity: Yes    Birth control/protection: Surgical  Other Topics Concern   Not on file  Social History Narrative   Not on file   Social Determinants of Health   Financial Resource Strain: Not on file  Food Insecurity: Not on file  Transportation Needs: Not on file  Physical Activity: Not on file  Stress: Not on file  Social Connections: Not on file    Allergies:  Allergies  Allergen Reactions   Latex Rash   Penicillins Rash    Has patient had a PCN reaction causing immediate rash, facial/tongue/throat swelling, SOB or lightheadedness with hypotension: Yes  Has patient had a PCN reaction causing severe rash involving mucus membranes or skin necrosis: No Has patient had a PCN reaction that required hospitalization No Has patient had a PCN reaction occurring within the last 10 years: No If all of the above answers are "NO", then may proceed with Cephalosporin use.     Metabolic Disorder Labs: No results found for: HGBA1C, MPG No results found for: PROLACTIN Lab Results  Component Value Date   CHOL 226 (H) 01/25/2008   TRIG 152 (H) 01/25/2008   HDL 63 01/25/2008   CHOLHDL 3.6 Ratio 01/25/2008   VLDL 30 01/25/2008   LDLCALC 133 (H) 01/25/2008   Lab Results  Component Value Date   TSH 1.370 02/16/2018   TSH 0.812 01/25/2008    Therapeutic  Level Labs: No results found for: LITHIUM No results found for: VALPROATE No components found for:  CBMZ  Current Medications: Current Outpatient Medications  Medication Sig Dispense Refill   propranolol (INDERAL) 10 MG tablet Take 1 tablet (10 mg total) by mouth 3 (three) times daily. 90 tablet 3   gabapentin (NEURONTIN) 300 MG capsule Take 1-2 capsules (300-600 mg total) by mouth See admin instructions. Take 300 mg every morning and 600 mg every night at bedtime. 90 capsule 2   ibuprofen (ADVIL,MOTRIN) 200 MG tablet Take 200 mg by mouth every 6 (six) hours as needed for mild pain or moderate pain.  OLANZapine (ZYPREXA) 15 MG tablet Take 1 tablet (15 mg total) by mouth at bedtime. 30 tablet 3   omeprazole (PRILOSEC) 20 MG capsule Take 20 mg by mouth daily.     sertraline (ZOLOFT) 100 MG tablet Take 1 tablet (100 mg total) by mouth daily. 30 tablet 3   No current facility-administered medications for this visit.     Musculoskeletal: Strength & Muscle Tone:  Unable to assess due to telephone visit Gait & Station:  Unable to assess due to telephone visit Patient leans: N/A  Psychiatric Specialty Exam: Review of Systems  unknown if currently breastfeeding.There is no height or weight on file to calculate BMI.  General Appearance:  Unable to assess due to telephone visit  Eye Contact:  Good  Speech:  Clear and Coherent and Normal Rate  Volume:  Normal  Mood:  Anxious  Affect:  Congruent  Thought Process:  Coherent, Goal Directed and Linear  Orientation:  Full (Time, Place, and Person)  Thought Content: WDL and Logical   Suicidal Thoughts:  No  Homicidal Thoughts:  No  Memory:  Immediate;   Good Recent;   Good Remote;   Good  Judgement:  Good  Insight:  Good  Psychomotor Activity:   Unable to assess due to telephone visit  Concentration:  Concentration: Good and Attention Span: Good  Recall:  Good  Fund of Knowledge: Good  Language: Good  Akathisia:  NA  Handed:  Right   AIMS (if indicated): not done  Assets:  Communication Skills Desire for Improvement Financial Resources/Insurance Housing Social Support  ADL's:  Intact  Cognition: WNL  Sleep:  Good   Screenings: GAD-7    Flowsheet Row Video Visit from 01/27/2021 in [facility] Clinical Support from 07/04/2020 in [facility] Clinical Support from 05/06/2020 in [facility] Clinical Support from 02/05/2020 in [facility] Visit from 02/17/2018 in [facility]  Total GAD-7 Score 7 9 6  0 7      PHQ2-9    Flowsheet Row Video Visit from 01/27/2021 in [facility] Clinical Support from 07/04/2020 in [facility] Clinical Support from 05/06/2020 in [facility] Clinical Support from 02/05/2020 in [facility] Office Visit from 02/17/2018 in [facility]  PHQ-2 Total Score 1 0 3 2 2   PHQ-9 Total Score 6 7 10 10 7       Flowsheet Row ED from 08/31/2020 in [facility] Clinical Support from 07/04/2020 in [facility]  C-SSRS RISK CATEGORY No Risk No Risk        Assessment and Plan: Patient notes that she has been more anxious and endorses symptoms of hypomania. Today she is agreeable to starting propanolol 10 mg 3 times daily started to help manage anxiety and blood pressure.  She is also agreeable to increasing Zyprexa 10 mg to 15 mg to help manage mood. Patient will follow-up with community health and wellness for further management of blood pressure.  She will continue all other medications as prescribed  1. Bipolar 1 disorder (HCC)  Continue- gabapentin (NEURONTIN) 300 MG capsule; Take 1-2 capsules (300-600 mg total) by mouth See admin instructions. Take 300 mg every  morning and 600 mg every night at bedtime.  Dispense: 90 capsule; Refill: 2 Increased- OLANZapine (ZYPREXA) 15 MG tablet; Take 1 tablet (15 mg  total) by mouth at bedtime.  Dispense: 30 tablet; Refill: 3 Continue- sertraline (ZOLOFT) 100 MG tablet; Take 1 tablet (100 mg total) by mouth daily.  Dispense: 30 tablet; Refill: 3  2. Generalized anxiety disorder  Start- propranolol (INDERAL) 10 MG tablet; Take 1 tablet (10 mg total) by mouth 3 (three) times daily.  Dispense: 90 tablet; Refill: 3 Continue- sertraline (ZOLOFT) 100 MG tablet; Take 1 tablet (100 mg total) by mouth daily.  Dispense: 30 tablet; Refill: 3   Follow up in 3 months Follow up with therapy.   Shanna Cisco, NP 01/27/2021, 3:50 PM

## 2021-02-13 ENCOUNTER — Ambulatory Visit (INDEPENDENT_AMBULATORY_CARE_PROVIDER_SITE_OTHER): Payer: No Payment, Other | Admitting: Licensed Clinical Social Worker

## 2021-02-13 DIAGNOSIS — F411 Generalized anxiety disorder: Secondary | ICD-10-CM
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
## 2021-03-10 ENCOUNTER — Ambulatory Visit (HOSPITAL_COMMUNITY): Payer: No Payment, Other | Admitting: Licensed Clinical Social Worker

## 2021-03-10 DIAGNOSIS — F411 Generalized anxiety disorder: Secondary | ICD-10-CM
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
## 2021-04-23 ENCOUNTER — Telehealth (INDEPENDENT_AMBULATORY_CARE_PROVIDER_SITE_OTHER): Payer: No Payment, Other | Admitting: Psychiatry

## 2021-04-23 ENCOUNTER — Encounter (HOSPITAL_COMMUNITY): Payer: Self-pay | Admitting: Psychiatry

## 2021-04-23 DIAGNOSIS — F411 Generalized anxiety disorder: Secondary | ICD-10-CM

## 2021-04-23 DIAGNOSIS — F319 Bipolar disorder, unspecified: Secondary | ICD-10-CM

## 2021-04-23 MED ORDER — GABAPENTIN 300 MG PO CAPS: 300.0000 mg | ORAL_CAPSULE | ORAL | 3 refills | Status: DC

## 2021-04-23 MED ORDER — PROPRANOLOL HCL 10 MG PO TABS: 10.0000 mg | ORAL_TABLET | Freq: Three times a day (TID) | ORAL | 3 refills | Status: DC

## 2021-04-23 MED ORDER — OLANZAPINE 15 MG PO TABS: 15.0000 mg | ORAL_TABLET | Freq: Every day | ORAL | 3 refills | Status: DC

## 2021-04-23 MED ORDER — SERTRALINE HCL 100 MG PO TABS: 100.0000 mg | ORAL_TABLET | Freq: Every day | ORAL | 3 refills | Status: DC

## 2021-04-23 NOTE — Progress Notes (Signed)
BH MD/PA/NP OP Progress Note Virtual Visit via Video Note  I connected with [name] on 04/23/21 at  4:00 PM EST by a video enabled telemedicine application and verified that I am speaking with the correct person using two identifiers.  Location: Patient: Home Provider: Clinic   I discussed the limitations of evaluation and management by telemedicine and the availability of in person appointments. The patient expressed understanding and agreed to proceed.  I provided 30 minutes of non-face-to-face time during this encounter.       04/23/2021 3:58 PM [name]  MRN:  [id number]  Chief Complaint: "Like the increase in Zyprexa and the propanolol"    HPI: 46 year old female seen today follow-up psychiatric evaluation.  She has a psychiatric history of bipolar 1 disorder, anxiety, and depression.  She is currently being managed on propanolol 10 mg 3 times daily, Zyprexa 15 mg, Zoloft 100 mg, hydroxyzine 25 mg three times daily, gabapentin 300 mg am and gabapentin 600 HS.  She notes that her medications are effective in managing her psychiatric conditions.  Today she was well-groomed, pleasant, cooperative, engaged in conversation, and maintained eye contact.  She informed Clinical research associate that the increase in Zyprexa and starting propanolol has effectively managed her mood and anxiety.  She notes that she continues to work at [employer] and find enjoyment in her job.  Patient reports that her anxiety depression continues to be minimal.  Provider conducted a GAD-7 and patient scored a 3, at her last visit she scored a 7.  Provider also conducted PHQ-9 and patient scored a 1, at her last visit she scored a 6.  Today she denies SI/HI/VAH, mania, paranoia.   No medication changes made today.  Patient agreeable to continue medication as prescribed.  Patient will follow-up with outpatient counseling for therapy.  No other concerns at this time.      Visit Diagnosis:    ICD-10-CM   1. Bipolar 1  disorder (HCC)  F31.9 OLANZapine (ZYPREXA) 15 MG tablet    sertraline (ZOLOFT) 100 MG tablet    gabapentin (NEURONTIN) 300 MG capsule    2. Generalized anxiety disorder  F41.1 sertraline (ZOLOFT) 100 MG tablet    propranolol (INDERAL) 10 MG tablet      Past Psychiatric History:  bipolar 1 disorder, anxiety, and depression.   Past Medical History:  Past Medical History:  Diagnosis Date   Bipolar 1 disorder (HCC)    History of miscarriage    Hypertension     Past Surgical History:  Procedure Laterality Date   NO PAST SURGERIES     TUBAL LIGATION N/A 04/13/2014   Procedure: POST PARTUM TUBAL LIGATION;  Surgeon: [name], MD;  Location: [facility];  Service: Gynecology;  Laterality: N/A;    Family Psychiatric History: Son Depression, Daughter ADHD and Depression, Father Bipolar disorder  Family History:  Family History  Problem Relation Age of Onset   Cancer Mother 45       breast   Cancer Maternal Grandmother 52       breast   Cancer Maternal Grandfather        stomach   Hypertension Father    Heart disease Father     Social History:  Social History   Socioeconomic History   Marital status: Divorced    Spouse name: Not on file   Number of children: Not on file   Years of education: Not on file   Highest education level: Not on file  Occupational History  Not on file  Tobacco Use   Smoking status: Every Day    Packs/day: 0.50    Types: Cigarettes   Smokeless tobacco: Never  Vaping Use   Vaping Use: Never used  Substance and Sexual Activity   Alcohol use: Yes    Alcohol/week: 24.0 standard drinks    Types: 24 Cans of beer per week   Drug use: Yes    Types: Marijuana   Sexual activity: Yes    Birth control/protection: Surgical  Other Topics Concern   Not on file  Social History Narrative   Not on file   Social Determinants of Health   Financial Resource Strain: Not on file  Food Insecurity: Not on file  Transportation Needs: Not on file  Physical  Activity: Not on file  Stress: Not on file  Social Connections: Not on file    Allergies:  Allergies  Allergen Reactions   Latex Rash   Penicillins Rash    Has patient had a PCN reaction causing immediate rash, facial/tongue/throat swelling, SOB or lightheadedness with hypotension: Yes  Has patient had a PCN reaction causing severe rash involving mucus membranes or skin necrosis: No Has patient had a PCN reaction that required hospitalization No Has patient had a PCN reaction occurring within the last 10 years: No If all of the above answers are "NO", then may proceed with Cephalosporin use.     Metabolic Disorder Labs: No results found for: HGBA1C, MPG No results found for: PROLACTIN Lab Results  Component Value Date   CHOL 226 (H) 01/25/2008   TRIG 152 (H) 01/25/2008   HDL 63 01/25/2008   CHOLHDL 3.6 Ratio 01/25/2008   VLDL 30 01/25/2008   LDLCALC 133 (H) 01/25/2008   Lab Results  Component Value Date   TSH 1.370 02/16/2018   TSH 0.812 01/25/2008    Therapeutic Level Labs: No results found for: LITHIUM No results found for: VALPROATE No components found for:  CBMZ  Current Medications: Current Outpatient Medications  Medication Sig Dispense Refill   gabapentin (NEURONTIN) 300 MG capsule Take 1-2 capsules (300-600 mg total) by mouth See admin instructions. Take 300 mg every morning and 600 mg every night at bedtime. 90 capsule 3   ibuprofen (ADVIL,MOTRIN) 200 MG tablet Take 200 mg by mouth every 6 (six) hours as needed for mild pain or moderate pain.     OLANZapine (ZYPREXA) 15 MG tablet Take 1 tablet (15 mg total) by mouth at bedtime. 30 tablet 3   omeprazole (PRILOSEC) 20 MG capsule Take 20 mg by mouth daily.     propranolol (INDERAL) 10 MG tablet Take 1 tablet (10 mg total) by mouth 3 (three) times daily. 90 tablet 3   sertraline (ZOLOFT) 100 MG tablet Take 1 tablet (100 mg total) by mouth daily. 30 tablet 3   No current facility-administered medications for  this visit.     Musculoskeletal: Strength & Muscle Tone:  Unable to assess due to telehealth visit Gait & Station:  Unable to assess due to telehealth visit Patient leans: N/A  Psychiatric Specialty Exam: Review of Systems  unknown if currently breastfeeding.There is no height or weight on file to calculate BMI.  General Appearance: Well Groomed  Eye Contact:  Good  Speech:  Clear and Coherent and Normal Rate  Volume:  Normal  Mood:  Euthymic  Affect:  Congruent  Thought Process:  Coherent, Goal Directed and Linear  Orientation:  Full (Time, Place, and Person)  Thought Content: WDL and Logical  Suicidal Thoughts:  No  Homicidal Thoughts:  No  Memory:  Immediate;   Good Recent;   Good Remote;   Good  Judgement:  Good  Insight:  Good  Psychomotor Activity:  Normal  Concentration:  Concentration: Good and Attention Span: Good  Recall:  Good  Fund of Knowledge: Good  Language: Good  Akathisia:  NA  Handed:  Right  AIMS (if indicated): not done  Assets:  Communication Skills Desire for Improvement Financial Resources/Insurance Housing Social Support  ADL's:  Intact  Cognition: WNL  Sleep:  Good   Screenings: GAD-7    Flowsheet Row [facility]  Total GAD-7 Score 3 7 9 6  0      PHQ2-9    Flowsheet Row [facility]2020 [facility]  PHQ-2 Total Score 0 1 1 0 3  PHQ-9 Total Score 1 -- 6 7 10       Flowsheet Row [facility]20[facility] Counsel[facility]20[facility] from [facility]31/2020 [facility]  C-SSRS RISK CATEGORY No Risk No Risk No Risk        Assessment and Plan: Patient reports that she is doing well on her current medication regimen.  No medication changes made today.  Patient agreeable to continue medication as prescribed.  1. Bipolar 1 disorder (HCC)  Continue- gabapentin (NEURONTIN) 300 MG capsule; Take 1-2 capsules (300-600 mg total) by mouth See admin instructions. Take 300 mg every morning and 600 mg every night at bedtime.  Dispense: 90 capsule; Refill: 2 Continue- OLANZapine (ZYPREXA) 15 MG tablet; Take 1 tablet (15 mg total) by mouth at bedtime.  Dispense: 30 tablet; Refill: 3 Continue- sertraline (ZOLOFT) 100 MG tablet; Take 1 tablet (100 mg total) by mouth daily.  Dispense: 30 tablet; Refill: 3  2. Generalized anxiety disorder  Continue- propranolol (INDERAL) 10 MG tablet; Take 1 tablet (10 mg total) by mouth 3 (three) times daily.  Dispense: 90 tablet; Refill: 3 Continue- sertraline (ZOLOFT) 100 MG tablet; Take 1 tablet (100 mg total) by mouth daily.  Dispense: 30 tablet; Refill: 3   Follow up in 3 months Follow up with therapy.   Shanna CiscoBrittney E Dauntae Derusha, NP 04/23/2021, 3:58 PM

## 2021-05-15 ENCOUNTER — Ambulatory Visit (HOSPITAL_COMMUNITY): Payer: No Payment, Other | Admitting: Licensed Clinical Social Worker

## 2021-05-15 ENCOUNTER — Telehealth (HOSPITAL_COMMUNITY): Payer: Self-pay | Admitting: Licensed Clinical Social Worker

## 2021-05-15 ENCOUNTER — Encounter (HOSPITAL_COMMUNITY): Payer: Self-pay

## 2021-05-15 NOTE — Telephone Encounter (Signed)
LCSW sent text message with link for video session per schedule. LCSW remained online available for session until 3:12pm. Pt failed to sign on for session.

## 2021-06-23 ENCOUNTER — Ambulatory Visit (HOSPITAL_COMMUNITY): Payer: No Payment, Other | Admitting: Licensed Clinical Social Worker

## 2021-06-23 ENCOUNTER — Encounter (HOSPITAL_COMMUNITY): Payer: Self-pay

## 2021-06-23 ENCOUNTER — Telehealth (HOSPITAL_COMMUNITY): Payer: Self-pay | Admitting: Licensed Clinical Social Worker

## 2021-06-23 NOTE — Telephone Encounter (Signed)
LCSW sent text message with link for video session per schedule.  LCSW remained online and available for session until 3:12.  Patient failed to sign on for session. ?

## 2021-07-08 ENCOUNTER — Encounter (HOSPITAL_COMMUNITY): Payer: Self-pay

## 2021-07-08 ENCOUNTER — Telehealth (HOSPITAL_COMMUNITY): Payer: No Payment, Other | Admitting: Psychiatry

## 2021-10-06 ENCOUNTER — Other Ambulatory Visit (HOSPITAL_COMMUNITY): Payer: Self-pay | Admitting: Psychiatry

## 2021-10-06 DIAGNOSIS — F319 Bipolar disorder, unspecified: Secondary | ICD-10-CM

## 2021-10-06 DIAGNOSIS — F411 Generalized anxiety disorder: Secondary | ICD-10-CM

## 2021-10-16 ENCOUNTER — Other Ambulatory Visit (HOSPITAL_COMMUNITY): Payer: Self-pay | Admitting: Psychiatry

## 2021-10-16 ENCOUNTER — Encounter (HOSPITAL_COMMUNITY): Payer: Self-pay

## 2021-10-16 ENCOUNTER — Telehealth (HOSPITAL_COMMUNITY): Payer: No Payment, Other | Admitting: Psychiatry

## 2021-10-16 ENCOUNTER — Telehealth (HOSPITAL_COMMUNITY): Payer: Self-pay | Admitting: Psychiatry

## 2021-10-16 DIAGNOSIS — F319 Bipolar disorder, unspecified: Secondary | ICD-10-CM

## 2021-10-16 DIAGNOSIS — F411 Generalized anxiety disorder: Secondary | ICD-10-CM

## 2021-10-16 MED ORDER — SERTRALINE HCL 100 MG PO TABS: 100.0000 mg | ORAL_TABLET | Freq: Every day | ORAL | 3 refills | Status: DC

## 2021-10-16 MED ORDER — PROPRANOLOL HCL 10 MG PO TABS: 10.0000 mg | ORAL_TABLET | Freq: Three times a day (TID) | ORAL | 3 refills | Status: DC

## 2021-10-16 MED ORDER — GABAPENTIN 300 MG PO CAPS: 300.0000 mg | ORAL_CAPSULE | ORAL | 3 refills | Status: DC

## 2021-10-16 MED ORDER — OLANZAPINE 15 MG PO TABS: 15.0000 mg | ORAL_TABLET | Freq: Every day | ORAL | 3 refills | Status: DC

## 2021-10-16 NOTE — Telephone Encounter (Signed)
Medications refilled and sent to preferred pharmacy.

## 2021-11-05 ENCOUNTER — Telehealth (INDEPENDENT_AMBULATORY_CARE_PROVIDER_SITE_OTHER): Payer: No Payment, Other | Admitting: Psychiatry

## 2021-11-05 ENCOUNTER — Encounter (HOSPITAL_COMMUNITY): Payer: Self-pay | Admitting: Psychiatry

## 2021-11-05 DIAGNOSIS — F411 Generalized anxiety disorder: Secondary | ICD-10-CM | POA: Diagnosis not present

## 2021-11-05 DIAGNOSIS — F319 Bipolar disorder, unspecified: Secondary | ICD-10-CM

## 2021-11-05 MED ORDER — PROPRANOLOL HCL 10 MG PO TABS: 10.0000 mg | ORAL_TABLET | Freq: Three times a day (TID) | ORAL | 3 refills | Status: DC

## 2021-11-05 MED ORDER — RISPERIDONE 1 MG PO TABS: 1.0000 mg | ORAL_TABLET | Freq: Every day | ORAL | 3 refills | Status: DC

## 2021-11-05 MED ORDER — GABAPENTIN 300 MG PO CAPS: 300.0000 mg | ORAL_CAPSULE | ORAL | 3 refills | Status: DC

## 2021-11-05 MED ORDER — SERTRALINE HCL 100 MG PO TABS: 100.0000 mg | ORAL_TABLET | Freq: Every day | ORAL | 3 refills | Status: DC

## 2021-11-05 NOTE — Progress Notes (Signed)
BH MD/PA/NP OP Progress Note Virtual Visit via Telephone Note  I connected with [name] on 11/05/21 at  4:00 PM EDT by telephone and verified that I am speaking with the correct person using two identifiers.  Location: Patient: home Provider: Clinic   I discussed the limitations, risks, security and privacy concerns of performing an evaluation and management service by telephone and the availability of in person appointments. I also discussed with the patient that there may be a patient responsible charge related to this service. The patient expressed understanding and agreed to proceed.   I provided 30 minutes of non-face-to-face time during this encounter.       11/05/2021 1:48 PM [name]  MRN:  [id number]  Chief Complaint: "The Zyprexa is not working anymore"    HPI: 46 year old female seen today follow-up psychiatric evaluation.  She has a psychiatric history of bipolar 1 disorder, anxiety, and depression.  She is currently being managed on propanolol 10 mg 3 times daily, Zyprexa 15 mg, Zoloft 100 mg, hydroxyzine 25 mg three times daily, gabapentin 300 mg am and gabapentin 600 HS.  She notes that her medications are somewhat effective in managing her psychiatric conditions.  Today she was unable to logon virtually so her assessment was done over the phone.  During exam she was pleasant, cooperative, and engaged in conversation.  She informed Clinical research associate that the Zyprexa is no longer working.  Patient reports that she was without her medication for a few weeks.  She notes that when she restarted her Zyprexa she became sick.  Patient informed Clinical research associate that she has not taken Zyprexa in over 2 weeks because of this.  She reports that she would like to try another antipsychotic as she is experiencing hypomania.  She endorses fluctuations in mood, racing thoughts, irritability, and impulsive behaviors.  Patient also notes that her anxiety has been increased.  Provider conducted a GAD-7  and patient scored a 12.  Provider also conducted PHQ-9 and patient scored a 16.  She reports that her sleep has been poor noting that she sleeps approximately 2 hours nightly.  Patient reports that she has also had a poor appetite and has lost 3 pounds.  Today she denies SI/HI/AVH or paranoia.    Patient informed Clinical research associate that her hypomanic state is interfering with her job.  Today she is agreeable to starting risperidone 1 mg nightly to help manage sleep, mood, and anxiety. Potential side effects of medication and risks vs benefits of treatment vs non-treatment were explained and discussed. All questions were answered. She will continue her other medications as prescribed.  No other concerns at this time.     Visit Diagnosis:    ICD-10-CM   1. Bipolar 1 disorder (HCC)  F31.9 risperiDONE (RISPERDAL) 1 MG tablet    gabapentin (NEURONTIN) 300 MG capsule    sertraline (ZOLOFT) 100 MG tablet    2. Generalized anxiety disorder  F41.1 propranolol (INDERAL) 10 MG tablet    sertraline (ZOLOFT) 100 MG tablet      Past Psychiatric History:  bipolar 1 disorder, anxiety, and depression.   Past Medical History:  Past Medical History:  Diagnosis Date   Bipolar 1 disorder (HCC)    History of miscarriage    Hypertension     Past Surgical History:  Procedure Laterality Date   NO PAST SURGERIES     TUBAL LIGATION N/A 04/13/2014   Procedure: POST PARTUM TUBAL LIGATION;  Surgeon: [name], MD;  Location: [facility];  Service: Gynecology;  Laterality: N/A;    Family Psychiatric History: Son Depression, Daughter ADHD and Depression, Father Bipolar disorder  Family History:  Family History  Problem Relation Age of Onset   Cancer Mother 73       breast   Cancer Maternal Grandmother 35       breast   Cancer Maternal Grandfather        stomach   Hypertension Father    Heart disease Father     Social History:  Social History   Socioeconomic History   Marital status: Divorced    Spouse name:  Not on file   Number of children: Not on file   Years of education: Not on file   Highest education level: Not on file  Occupational History   Not on file  Tobacco Use   Smoking status: Every Day    Packs/day: 0.50    Types: Cigarettes   Smokeless tobacco: Never  Vaping Use   Vaping Use: Never used  Substance and Sexual Activity   Alcohol use: Yes    Alcohol/week: 24.0 standard drinks of alcohol    Types: 24 Cans of beer per week   Drug use: Yes    Types: Marijuana   Sexual activity: Yes    Birth control/protection: Surgical  Other Topics Concern   Not on file  Social History Narrative   Not on file   Social Determinants of Health   Financial Resource Strain: Not on file  Food Insecurity: Not on file  Transportation Needs: Not on file  Physical Activity: Not on file  Stress: Not on file  Social Connections: Not on file    Allergies:  Allergies  Allergen Reactions   Latex Rash   Penicillins Rash    Has patient had a PCN reaction causing immediate rash, facial/tongue/throat swelling, SOB or lightheadedness with hypotension: Yes  Has patient had a PCN reaction causing severe rash involving mucus membranes or skin necrosis: No Has patient had a PCN reaction that required hospitalization No Has patient had a PCN reaction occurring within the last 10 years: No If all of the above answers are "NO", then may proceed with Cephalosporin use.     Metabolic Disorder Labs: No results found for: "HGBA1C", "MPG" No results found for: "PROLACTIN" Lab Results  Component Value Date   CHOL 226 (H) 01/25/2008   TRIG 152 (H) 01/25/2008   HDL 63 01/25/2008   CHOLHDL 3.6 Ratio 01/25/2008   VLDL 30 01/25/2008   LDLCALC 133 (H) 01/25/2008   Lab Results  Component Value Date   TSH 1.370 02/16/2018   TSH 0.812 01/25/2008    Therapeutic Level Labs: No results found for: "LITHIUM" No results found for: "VALPROATE" No results found for: "CBMZ"  Current Medications: Current  Outpatient Medications  Medication Sig Dispense Refill   risperiDONE (RISPERDAL) 1 MG tablet Take 1 tablet (1 mg total) by mouth at bedtime. 30 tablet 3   gabapentin (NEURONTIN) 300 MG capsule Take 1-2 capsules (300-600 mg total) by mouth See admin instructions. Take 300 mg every morning and 600 mg every night at bedtime. 90 capsule 3   ibuprofen (ADVIL,MOTRIN) 200 MG tablet Take 200 mg by mouth every 6 (six) hours as needed for mild pain or moderate pain.     omeprazole (PRILOSEC) 20 MG capsule Take 20 mg by mouth daily.     propranolol (INDERAL) 10 MG tablet Take 1 tablet (10 mg total) by mouth 3 (three) times daily. 90 tablet 3  sertraline (ZOLOFT) 100 MG tablet Take 1 tablet (100 mg total) by mouth daily. 30 tablet 3   No current facility-administered medications for this visit.     Musculoskeletal: Strength & Muscle Tone:  Unable to assess due to telephone visit Gait & Station:  Unable to assess due to telephone visit Patient leans: N/A  Psychiatric Specialty Exam: Review of Systems  unknown if currently breastfeeding.There is no height or weight on file to calculate BMI.  General Appearance:  Unable to assess due to telephone visit  Eye Contact:   Unable to assess due to telephone visit  Speech:  Clear and Coherent and Normal Rate  Volume:  Normal  Mood:   Unable to assess due to telephone visit  Affect:  Congruent  Thought Process:  Coherent, Goal Directed and Linear  Orientation:  Full (Time, Place, and Person)  Thought Content: WDL and Logical   Suicidal Thoughts:  No  Homicidal Thoughts:  No  Memory:  Immediate;   Good Recent;   Good Remote;   Good  Judgement:  Good  Insight:  Good  Psychomotor Activity:   Unable to assess due to telephone visit  Concentration:  Concentration: Good and Attention Span: Good  Recall:  Good  Fund of Knowledge: Good  Language: Good  Akathisia:   Unable to assess due to telephone visit  Handed:  Right  AIMS (if indicated): not done   Assets:  Communication Skills Desire for Improvement Financial Resources/Insurance Housing Social Support  ADL's:  Intact  Cognition: WNL  Sleep:  Poor   Screenings: GAD-7    Flowsheet Row Video Visit from 11/05/2021 in [facility] Video Visit from 04/23/2021 in [facility] Video Visit from 01/27/2021 in [facility] Clinical Support from 07/04/2020 in [facility] Clinical Support from 05/06/2020 in [facility]  Total GAD-7 Score 12 3 7 9 6       PHQ2-9    Flowsheet Row Video Visit from 11/05/2021 in [facility] Video Visit from 04/23/2021 in [facility] Counselor from 02/13/2021 in [facility] Video Visit from 01/27/2021 in [facility] Clinical Support from 07/04/2020 in [facility]  PHQ-2 Total Score 4 0 1 1 0  PHQ-9 Total Score 16 1 -- 6 7      Flowsheet Row Video Visit from 04/23/2021 in [facility] Counselor from 02/13/2021 in [facility] ED from 08/31/2020 in [facility]  C-SSRS RISK CATEGORY No Risk No Risk No Risk        Assessment and Plan: Patient reports that she is doing well on her current medication regimen.  No medication changes made today.  Patient agreeable to continue medication as prescribed.  1. Bipolar 1 disorder (HCC)  Continue- gabapentin (NEURONTIN) 300 MG capsule; Take 1-2 capsules (300-600 mg total) by mouth See admin instructions. Take 300 mg every morning and 600 mg every night at bedtime.  Dispense: 90 capsule; Refill: 2 Continue- OLANZapine (ZYPREXA) 15 MG tablet; Take 1 tablet (15 mg total) by mouth at bedtime.  Dispense: 30 tablet; Refill: 3 Continue- sertraline (ZOLOFT) 100 MG tablet;  Take 1 tablet (100 mg total) by mouth daily.  Dispense: 30 tablet; Refill: 3  2. Generalized anxiety disorder  Continue- propranolol (INDERAL) 10 MG tablet; Take 1 tablet (10 mg total) by mouth 3 (  three) times daily.  Dispense: 90 tablet; Refill: 3 Continue- sertraline (ZOLOFT) 100 MG tablet; Take 1 tablet (100 mg total) by mouth daily.  Dispense: 30 tablet; Refill: 3   Follow up in 3 months Follow up with therapy.   Shanna Cisco, NP 11/05/2021, 1:48 PM

## 2021-11-26 ENCOUNTER — Telehealth (HOSPITAL_COMMUNITY): Payer: Self-pay | Admitting: *Deleted

## 2021-11-26 NOTE — Telephone Encounter (Signed)
Riot called states the Risperidone she was started on recently for her bipolar do is not helping her and she would like to speak with her about a medication change. Dr Doyne Keel has left for the day but will ask her to call her tomorrow to discuss this concern.

## 2021-11-27 ENCOUNTER — Other Ambulatory Visit (HOSPITAL_COMMUNITY): Payer: Self-pay | Admitting: Psychiatry

## 2021-11-27 DIAGNOSIS — F319 Bipolar disorder, unspecified: Secondary | ICD-10-CM

## 2021-11-27 MED ORDER — RISPERIDONE 1 MG PO TABS: 1.5000 mg | ORAL_TABLET | Freq: Two times a day (BID) | ORAL | 3 refills | Status: DC

## 2021-11-27 NOTE — Telephone Encounter (Signed)
Patient informed writer that her anxiety and depression are well managed however notes that her mood continues to fluctuate.  She endorses symptoms of hypomania such as distractibility, irritability, and racing thoughts.  Patient notes that she is having outbursts with her roommate and her son more frequently.  Patient request to switch medications for increased Risperdal.  Provider recommended maximizing Risperdal prior to changing medications.  She endorsed understanding and agreed.  Risperdal increased from 1 mg nightly to 1.5 mg twice daily.  She will continue all other medications as prescribed.  No other concerns at this time.

## 2021-12-10 ENCOUNTER — Telehealth (HOSPITAL_COMMUNITY): Payer: Self-pay

## 2021-12-10 ENCOUNTER — Other Ambulatory Visit (HOSPITAL_COMMUNITY): Payer: Self-pay | Admitting: Psychiatry

## 2021-12-10 MED ORDER — LAMOTRIGINE 25 MG PO TABS: 25.0000 mg | ORAL_TABLET | Freq: Every day | ORAL | 2 refills | Status: DC

## 2021-12-10 NOTE — Telephone Encounter (Signed)
Patient informed Clinical research associate that she has been more confused and forgetful.  She notes that she believes that Risperdal is causing her increased confusion.  She does note that her irritability and mood has improved however notes that she would like to discontinue Risperdal due to noted side effects.  Patient informed Clinical research associate that she was on Lamictal in the past and found it effective in managing her mood.  Today she is agreeable to starting Lamictal 25 mg for 2 weeks, she will increase to 50 mg on week 3, and 75 mg on week 5.  Patient will follow-up for further evaluation on 01/29/2022.

## 2021-12-15 ENCOUNTER — Telehealth (HOSPITAL_COMMUNITY): Payer: Self-pay | Admitting: *Deleted

## 2021-12-15 NOTE — Telephone Encounter (Signed)
Patient LVM stating she needs to speak with her provider-- stated that the medication is not working , having trouble with her memory & thoughts don't come out clear

## 2021-12-17 ENCOUNTER — Other Ambulatory Visit (HOSPITAL_COMMUNITY): Payer: Self-pay | Admitting: Psychiatry

## 2021-12-17 DIAGNOSIS — F319 Bipolar disorder, unspecified: Secondary | ICD-10-CM

## 2021-12-17 MED ORDER — TRAZODONE HCL 50 MG PO TABS: 50.0000 mg | ORAL_TABLET | Freq: Every evening | ORAL | 3 refills | Status: DC | PRN

## 2021-12-17 NOTE — Telephone Encounter (Signed)
Patient notes that she feels better since starting Lamictal. She notes however that she has issues sleeping. She notes that she is only sleeping 4.5 hours. Today she is agreeable to start trazodone 50 mg nightly as needed to help manage sleep.  Patient informed Probation officer that she believes her memory may have to do with her menopause.  She informed Probation officer that she is increasingly irritable due to night sweats and poor sleep.  Provider recommended having a neurology consult if memory issues does not improve.  She endorsed understanding and agreed.  No other concerns at this time.

## 2022-01-19 ENCOUNTER — Telehealth (HOSPITAL_COMMUNITY): Payer: Self-pay | Admitting: *Deleted

## 2022-01-19 NOTE — Telephone Encounter (Signed)
Patient called stated Dr Ronne Binning recently increased her -- traZODone (DESYREL) 50 MG tablet Take 1 tablet (50 mg total) by mouth at  bedtime as needed for sleep  " And that she is still having trouble sleeping & asked if she could have another increase?"

## 2022-01-20 ENCOUNTER — Other Ambulatory Visit (HOSPITAL_COMMUNITY): Payer: Self-pay | Admitting: Psychiatry

## 2022-01-20 DIAGNOSIS — F319 Bipolar disorder, unspecified: Secondary | ICD-10-CM

## 2022-01-20 MED ORDER — TRAZODONE HCL 100 MG PO TABS: 100.0000 mg | ORAL_TABLET | Freq: Every evening | ORAL | 3 refills | Status: DC | PRN

## 2022-01-20 NOTE — Telephone Encounter (Signed)
Patient informed writer that trazodone has been somewhat effective in managing her sleep. She notes that she sleeps 4-5 hours. Today she is agreeable to increasing Trazodone 50 mg to 100 mg nightly as needed to help manage sleep. Patient also informed writer that Lamictal has been working well in managing her mood. She denies current symptoms of mania. No other concerns noted at this time.

## 2022-01-29 ENCOUNTER — Encounter (HOSPITAL_COMMUNITY): Payer: Self-pay | Admitting: Psychiatry

## 2022-01-29 ENCOUNTER — Telehealth (INDEPENDENT_AMBULATORY_CARE_PROVIDER_SITE_OTHER): Payer: No Payment, Other | Admitting: Psychiatry

## 2022-01-29 DIAGNOSIS — F411 Generalized anxiety disorder: Secondary | ICD-10-CM

## 2022-01-29 DIAGNOSIS — F319 Bipolar disorder, unspecified: Secondary | ICD-10-CM

## 2022-01-29 MED ORDER — GABAPENTIN 300 MG PO CAPS: 300.0000 mg | ORAL_CAPSULE | ORAL | 3 refills | Status: DC

## 2022-01-29 MED ORDER — LAMOTRIGINE 100 MG PO TABS: 100.0000 mg | ORAL_TABLET | Freq: Every day | ORAL | 3 refills | Status: DC

## 2022-01-29 MED ORDER — PROPRANOLOL HCL 10 MG PO TABS: 10.0000 mg | ORAL_TABLET | Freq: Three times a day (TID) | ORAL | 3 refills | Status: DC

## 2022-01-29 MED ORDER — SERTRALINE HCL 100 MG PO TABS: 100.0000 mg | ORAL_TABLET | Freq: Every day | ORAL | 3 refills | Status: DC

## 2022-01-29 MED ORDER — TRAZODONE HCL 100 MG PO TABS: 100.0000 mg | ORAL_TABLET | Freq: Every evening | ORAL | 3 refills | Status: DC | PRN

## 2022-01-29 NOTE — Progress Notes (Signed)
[name] MD/PA/NP OP Progress Note Virtual Visit via Telephone Note  I connected with [name] on 01/29/22 at  3:30 PM EDT by telephone and verified that I am speaking with the correct person using two identifiers.  Location: Patient: home Provider: Clinic   I discussed the limitations, risks, security and privacy concerns of performing an evaluation and management service by telephone and the availability of in person appointments. I also discussed with the patient that there may be a patient responsible charge related to this service. The patient expressed understanding and agreed to proceed.   I provided 30 minutes of non-face-to-face time during this encounter.       01/29/2022 1:56 PM [name]  MRN:  [id number]  Chief Complaint: "Im doing well"    HPI: 46 year old female seen today follow-up psychiatric evaluation.  She has a psychiatric history of bipolar 1 disorder, anxiety, and depression.  She is currently being managed on propanolol 10 mg 3 times daily, Trazodone 100 mg, Zoloft 100 mg, Lamictal 75 mg daily, gabapentin 300 mg am and gabapentin 600 HS.  She notes that her medications are effective in managing her psychiatric conditions.  Today she was unable to logon virtually so her assessment was done over the phone.  During exam she was pleasant, cooperative, and engaged in conversation.  She informed Probation officer that she is doing really well.  She notes that since increasing trazodone she is sleeping better.  She also notes that she finds Lamictal effective in managing her moods.  Patient notes that she has been less anxious and depressed.  Provider conducted a GAD-7 and patient scored a 3, at her last visit she scored a 12.  Provider also conducted PHQ-9 and patient scored a 5, at her last visit she scored a 16.  She endorses adequate appetite.  Today she denies SI/HI/AVH, mania, paranoia.    Patient notes that her son is doing well.  She notes that she has been getting him  mental health treatment through his school.    Today Lamictal increased to 100 mg to help mood maintenance.  She will continue her other medications as prescribed.  No other concerns at this time.      Visit Diagnosis:    ICD-10-CM   1. Bipolar 1 disorder (HCC)  F31.9 gabapentin (NEURONTIN) 300 MG capsule    sertraline (ZOLOFT) 100 MG tablet    traZODone (DESYREL) 100 MG tablet    2. Generalized anxiety disorder  F41.1 propranolol (INDERAL) 10 MG tablet    sertraline (ZOLOFT) 100 MG tablet      Past Psychiatric History:  bipolar 1 disorder, anxiety, and depression.   Past Medical History:  Past Medical History:  Diagnosis Date   Bipolar 1 disorder ([place])    History of miscarriage    Hypertension     Past Surgical History:  Procedure Laterality Date   NO PAST SURGERIES     TUBAL LIGATION N/A 04/13/2014   Procedure: POST PARTUM TUBAL LIGATION;  Surgeon: Crawford Givens, MD;  Location: [facility];  Service: Gynecology;  Laterality: N/A;    Family Psychiatric History: Son Depression, Daughter ADHD and Depression, Father Bipolar disorder  Family History:  Family History  Problem Relation Age of Onset   Cancer Mother 18       breast   Cancer Maternal Grandmother 2       breast   Cancer Maternal Grandfather        stomach   Hypertension Father  Heart disease Father     Social History:  Social History   Socioeconomic History   Marital status: Divorced    Spouse name: Not on file   Number of children: Not on file   Years of education: Not on file   Highest education level: Not on file  Occupational History   Not on file  Tobacco Use   Smoking status: Every Day    Packs/day: 0.50    Types: Cigarettes   Smokeless tobacco: Never  Vaping Use   Vaping Use: Never used  Substance and Sexual Activity   Alcohol use: Yes    Alcohol/week: 24.0 standard drinks of alcohol    Types: 24 Cans of beer per week   Drug use: Yes    Types: Marijuana   Sexual activity: Yes     Birth control/protection: Surgical  Other Topics Concern   Not on file  Social History Narrative   Not on file   Social Determinants of Health   Financial Resource Strain: Not on file  Food Insecurity: Not on file  Transportation Needs: Not on file  Physical Activity: Not on file  Stress: Not on file  Social Connections: Not on file    Allergies:  Allergies  Allergen Reactions   Latex Rash   Penicillins Rash    Has patient had a PCN reaction causing immediate rash, facial/tongue/throat swelling, SOB or lightheadedness with hypotension: Yes  Has patient had a PCN reaction causing severe rash involving mucus membranes or skin necrosis: No Has patient had a PCN reaction that required hospitalization No Has patient had a PCN reaction occurring within the last 10 years: No If all of the above answers are "NO", then may proceed with Cephalosporin use.     Metabolic Disorder Labs: No results found for: "HGBA1C", "MPG" No results found for: "PROLACTIN" Lab Results  Component Value Date   CHOL 226 (H) 01/25/2008   TRIG 152 (H) 01/25/2008   HDL 63 01/25/2008   CHOLHDL 3.6 Ratio 01/25/2008   VLDL 30 01/25/2008   LDLCALC 133 (H) 01/25/2008   Lab Results  Component Value Date   TSH 1.370 02/16/2018   TSH 0.812 01/25/2008    Therapeutic Level Labs: No results found for: "LITHIUM" No results found for: "VALPROATE" No results found for: "CBMZ"  Current Medications: Current Outpatient Medications  Medication Sig Dispense Refill   gabapentin (NEURONTIN) 300 MG capsule Take 1-2 capsules (300-600 mg total) by mouth See admin instructions. Take 300 mg every morning and 600 mg every night at bedtime. 90 capsule 3   ibuprofen (ADVIL,MOTRIN) 200 MG tablet Take 200 mg by mouth every 6 (six) hours as needed for mild pain or moderate pain.     lamoTRIgine (LAMICTAL) 100 MG tablet Take 1 tablet (100 mg total) by mouth daily. Take 1 capsule daily 30 tablet 3   omeprazole (PRILOSEC) 20 MG  capsule Take 20 mg by mouth daily.     propranolol (INDERAL) 10 MG tablet Take 1 tablet (10 mg total) by mouth 3 (three) times daily. 90 tablet 3   sertraline (ZOLOFT) 100 MG tablet Take 1 tablet (100 mg total) by mouth daily. 30 tablet 3   traZODone (DESYREL) 100 MG tablet Take 1 tablet (100 mg total) by mouth at bedtime as needed for sleep. 30 tablet 3   No current facility-administered medications for this visit.     Musculoskeletal: Strength & Muscle Tone:  Unable to assess due to telephone visit Gait & Station:  Unable to  assess due to telephone visit Patient leans: N/A  Psychiatric Specialty Exam: Review of Systems  unknown if currently breastfeeding.There is no height or weight on file to calculate BMI.  General Appearance:  Unable to assess due to telephone visit  Eye Contact:   Unable to assess due to telephone visit  Speech:  Clear and Coherent and Normal Rate  Volume:  Normal  Mood:  Euthymic  Affect:  Congruent  Thought Process:  Coherent, Goal Directed and Linear  Orientation:  Full (Time, Place, and Person)  Thought Content: WDL and Logical   Suicidal Thoughts:  No  Homicidal Thoughts:  No  Memory:  Immediate;   Good Recent;   Good Remote;   Good  Judgement:  Good  Insight:  Good  Psychomotor Activity:   Unable to assess due to telephone visit  Concentration:  Concentration: Good and Attention Span: Good  Recall:  Good  Fund of Knowledge: Good  Language: Good  Akathisia:   Unable to assess due to telephone visit  Handed:  Right  AIMS (if indicated): not done  Assets:  Communication Skills Desire for Improvement Financial Resources/Insurance Housing Social Support  ADL's:  Intact  Cognition: WNL  Sleep:  Good   Screenings: GAD-7    Flowsheet Row Video Visit from 01/29/2022 in [facility] Video Visit from 11/05/2021 in [facility] Video Visit from 04/23/2021 in [facility] Video Visit from 01/27/2021 in [facility] Clinical Support from 07/04/2020 in [facility]  Total GAD-7 Score 3 12 3 7 9       PHQ2-9    Flowsheet Row Video Visit from 01/29/2022 in [facility] Video Visit from 11/05/2021 in [facility] Video Visit from 04/23/2021 in [facility] Counselor from 02/13/2021 in [facility] Video Visit from 01/27/2021 in [facility]  PHQ-2 Total Score 1 4 0 1 1  PHQ-9 Total Score 5 16 1  -- 6      Flowsheet Row Video Visit from 04/23/2021 in [facility] Counselor from 02/13/2021 in [facility] ED from 08/31/2020 in [facility]  C-SSRS RISK CATEGORY No Risk No Risk No Risk        Assessment and Plan: Patient reports that her sleep, mood, anxiety, and depression has improved since her last visit.Today Lamictal increased to 100 mg to help mood maintenance.  She will continue her other medications as prescribed.  No other concerns at this time.  1. Bipolar 1 disorder (HCC)  Continue- gabapentin (NEURONTIN) 300 MG capsule; Take 1-2 capsules (300-600 mg total) by mouth See admin instructions. Take 300 mg every morning and 600 mg every night at bedtime.  Dispense: 90 capsule; Refill: 3 Continue- sertraline (ZOLOFT) 100 MG tablet; Take 1 tablet (100 mg total) by mouth daily.  Dispense: 30 tablet; Refill: 3 Continue- traZODone (DESYREL) 100 MG tablet; Take 1 tablet (100 mg total) by mouth at bedtime as needed for sleep.  Dispense: 30 tablet; Refill: 3  2. Generalized anxiety disorder  Continue- propranolol (INDERAL) 10 MG tablet; Take 1 tablet (10 mg total) by mouth 3 (three) times daily.  Dispense: 90 tablet; Refill: 3 Continue- sertraline (ZOLOFT) 100 MG tablet; Take 1 tablet  (100 mg total) by mouth daily.  Dispense: 30 tablet; Refill: 3  Follow up in 3 months Follow up  with therapy.   Shanna Cisco, NP 01/29/2022, 1:56 PM

## 2022-04-15 ENCOUNTER — Other Ambulatory Visit (HOSPITAL_COMMUNITY): Payer: Self-pay | Admitting: Psychiatry

## 2022-04-15 ENCOUNTER — Telehealth (HOSPITAL_COMMUNITY): Payer: Self-pay | Admitting: *Deleted

## 2022-04-15 DIAGNOSIS — F319 Bipolar disorder, unspecified: Secondary | ICD-10-CM

## 2022-04-15 MED ORDER — TRAZODONE HCL 150 MG PO TABS: 150.0000 mg | ORAL_TABLET | Freq: Every evening | ORAL | 3 refills | Status: DC | PRN

## 2022-04-15 NOTE — Telephone Encounter (Signed)
Patient called & stated that she needs to speak with whoever has taken Eulis Canner place she would like to speak to someone about increasing her  lamoTRIgine (LAMICTAL) 100 MG tablet

## 2022-04-15 NOTE — Telephone Encounter (Signed)
Patient informed Probation officer that she would like her Lamictal increased because she has been more anxious.  Provider asked patient if she has been irritable, distractible, having racing thoughts, or fluctuations in mood.  She denies these symptoms.  Provider informed patient that Lamictal was not for anxiety but rather mood stabilization.  She endorsed understanding.  Provider suggested increasing Zoloft however patient reports that she finds it effective at this time.  She notes that she takes trazodone throughout the day and reports that it is ineffective in managing her anxiety and depression.  Today trazodone increased from 100 mg to 150 mg to help manage anxiety and depression.  Provider discussed symptoms of serotonin symptoms with patient.  She endorsed understanding.  No other concerns noted at this time.

## 2022-05-01 ENCOUNTER — Telehealth (INDEPENDENT_AMBULATORY_CARE_PROVIDER_SITE_OTHER): Payer: No Payment, Other | Admitting: Psychiatry

## 2022-05-01 DIAGNOSIS — F1090 Alcohol use, unspecified, uncomplicated: Secondary | ICD-10-CM | POA: Diagnosis not present

## 2022-05-01 DIAGNOSIS — F331 Major depressive disorder, recurrent, moderate: Secondary | ICD-10-CM | POA: Diagnosis not present

## 2022-05-01 DIAGNOSIS — F411 Generalized anxiety disorder: Secondary | ICD-10-CM | POA: Diagnosis not present

## 2022-05-01 DIAGNOSIS — F4310 Post-traumatic stress disorder, unspecified: Secondary | ICD-10-CM | POA: Diagnosis not present

## 2022-05-01 MED ORDER — SERTRALINE HCL 100 MG PO TABS: 100.0000 mg | ORAL_TABLET | Freq: Every day | ORAL | 2 refills | Status: DC

## 2022-05-01 MED ORDER — LAMOTRIGINE 100 MG PO TABS: 100.0000 mg | ORAL_TABLET | Freq: Every day | ORAL | 2 refills | Status: DC

## 2022-05-01 MED ORDER — PROPRANOLOL HCL 10 MG PO TABS: 10.0000 mg | ORAL_TABLET | Freq: Three times a day (TID) | ORAL | 2 refills | Status: DC

## 2022-05-01 MED ORDER — GABAPENTIN 300 MG PO CAPS: 300.0000 mg | ORAL_CAPSULE | ORAL | 3 refills | Status: DC

## 2022-05-01 MED ORDER — TRAZODONE HCL 150 MG PO TABS: 150.0000 mg | ORAL_TABLET | Freq: Every evening | ORAL | 2 refills | Status: DC | PRN

## 2022-05-01 MED ORDER — PRAZOSIN HCL 1 MG PO CAPS: 1.0000 mg | ORAL_CAPSULE | Freq: Every day | ORAL | 2 refills | Status: DC
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
## 2022-05-05 ENCOUNTER — Encounter (HOSPITAL_COMMUNITY): Payer: Self-pay | Admitting: Psychiatry

## 2022-05-29 ENCOUNTER — Telehealth (INDEPENDENT_AMBULATORY_CARE_PROVIDER_SITE_OTHER): Payer: No Payment, Other | Admitting: Psychiatry

## 2022-05-29 DIAGNOSIS — F4310 Post-traumatic stress disorder, unspecified: Secondary | ICD-10-CM

## 2022-05-29 DIAGNOSIS — F331 Major depressive disorder, recurrent, moderate: Secondary | ICD-10-CM

## 2022-05-29 DIAGNOSIS — F411 Generalized anxiety disorder: Secondary | ICD-10-CM

## 2022-05-29 DIAGNOSIS — F1090 Alcohol use, unspecified, uncomplicated: Secondary | ICD-10-CM

## 2022-05-29 MED ORDER — PROPRANOLOL HCL 10 MG PO TABS: 10.0000 mg | ORAL_TABLET | Freq: Three times a day (TID) | ORAL | 2 refills | Status: DC

## 2022-05-29 MED ORDER — GABAPENTIN 300 MG PO CAPS: 300.0000 mg | ORAL_CAPSULE | ORAL | 2 refills | Status: DC

## 2022-05-29 MED ORDER — SERTRALINE HCL 100 MG PO TABS: 150.0000 mg | ORAL_TABLET | Freq: Every day | ORAL | 2 refills | Status: DC

## 2022-05-29 MED ORDER — LAMOTRIGINE 100 MG PO TABS: 100.0000 mg | ORAL_TABLET | Freq: Every day | ORAL | 2 refills | Status: DC

## 2022-05-29 MED ORDER — TRAZODONE HCL 150 MG PO TABS: 150.0000 mg | ORAL_TABLET | Freq: Every evening | ORAL | 2 refills | Status: DC | PRN

## 2022-05-29 NOTE — Progress Notes (Cosign Needed Addendum)
BH MD/PA/NP OP Progress Note Virtual Visit via Video Note  I connected with [name] on 05/29/22 at  2:30 PM EST by a video enabled telemedicine application and verified that I am speaking with the correct person using two identifiers.  Location: Patient: Home Provider: Clinic   I discussed the limitations of evaluation and management by telemedicine and the availability of in person appointments. The patient expressed understanding and agreed to proceed. I discussed the assessment and treatment plan with the patient. The patient was provided an opportunity to ask questions and all were answered. The patient agreed with the plan and demonstrated an understanding of the instructions.   The patient was advised to call back or seek an in-person evaluation if the symptoms worsen or if the condition fails to improve as anticipated.  I provided 20 minutes of non-face-to-face time during this encounter.   Armando Reichert, MD   05/29/2022 2:42 PM [name]  MRN:  [id number]  Chief Complaint: Follow-up   HPI: 47 y.o.  female seen today follow-up psychiatric evaluation.  She has a psychiatric history of bipolar 1 disorder, anxiety, and depression.  She is currently being managed on propanolol 10 mg 3 times daily, Trazodone 150 mg, Zoloft 100 mg, Lamictal 100 mg daily, gabapentin 300 mg am and gabapentin 600 HS and Minipress 1 mg nightly (stopped due to side effects).  She notes that her medications are effective in managing her psychiatric conditions.  Pt reports that her her mood has been stable with current medications but she still feel anxious most of the time.  She gets overwhelmed and anxious at work if she is running behind and loud noises bother her.  She is excited that she is getting her license back and will start driving again in 1 to 2 months.  She reports that she tried Minipress but it made her nightmares worse and she fell one time .  She stopped taking Minipress after 4 days.   She denies having flashbacks.   She has been sleeping ok and reports stable appetite.  Currently, she is not reporting any suicidal ideations, homicidal ideations, auditory and visual hallucinations and paranoia. She denies any medication side effects and has been tolerating it well.   She denies using any illicit drugs at this time and has not drank since Thanksgiving.  She is proud of her accomplishments.  Denies any other concerns.  Discussed increasing Zoloft to help with anxiety. She agrees with the plan. Patient is alert and oriented x 4,  calm, cooperative, and fully engaged in conversation during the encounter.  Her thought process is linear with coherent speech. She does not appear to be responding to internal/external stimuli .     Visit Diagnosis:    ICD-10-CM   1. Generalized anxiety disorder  F41.1     2. PTSD (post-traumatic stress disorder)  F43.10     3. MDD (major depressive disorder), recurrent episode, moderate (HCC)  F33.1     4. Alcohol use disorder  F10.90       Past Psychiatric History:  bipolar 1 disorder, anxiety, and depression.   Past Medical History:  Past Medical History:  Diagnosis Date   Bipolar 1 disorder ([place])    History of miscarriage    Hypertension     Past Surgical History:  Procedure Laterality Date   NO PAST SURGERIES     TUBAL LIGATION N/A 04/13/2014   Procedure: POST PARTUM TUBAL LIGATION;  Surgeon: Crawford Givens, MD;  Location: [facility];  Service: Gynecology;  Laterality: N/A;    Family Psychiatric History: Son Depression, Daughter ADHD and Depression, Father Bipolar disorder  Family History:  Family History  Problem Relation Age of Onset   Cancer Mother 108       breast   Cancer Maternal Grandmother 72       breast   Cancer Maternal Grandfather        stomach   Hypertension Father    Heart disease Father     Social History:  Social History   Socioeconomic History   Marital status: Divorced    Spouse name: Not on file    Number of children: Not on file   Years of education: Not on file   Highest education level: Not on file  Occupational History   Not on file  Tobacco Use   Smoking status: Every Day    Packs/day: 0.50    Types: Cigarettes   Smokeless tobacco: Never  Vaping Use   Vaping Use: Never used  Substance and Sexual Activity   Alcohol use: Yes    Alcohol/week: 24.0 standard drinks of alcohol    Types: 24 Cans of beer per week   Drug use: Yes    Types: Marijuana   Sexual activity: Yes    Birth control/protection: Surgical  Other Topics Concern   Not on file  Social History Narrative   Not on file   Social Determinants of Health   Financial Resource Strain: Not on file  Food Insecurity: Not on file  Transportation Needs: Not on file  Physical Activity: Not on file  Stress: Not on file  Social Connections: Not on file    Allergies:  Allergies  Allergen Reactions   Latex Rash   Penicillins Rash    Has patient had a PCN reaction causing immediate rash, facial/tongue/throat swelling, SOB or lightheadedness with hypotension: Yes  Has patient had a PCN reaction causing severe rash involving mucus membranes or skin necrosis: No Has patient had a PCN reaction that required hospitalization No Has patient had a PCN reaction occurring within the last 10 years: No If all of the above answers are "NO", then may proceed with Cephalosporin use.     Metabolic Disorder Labs: No results found for: "HGBA1C", "MPG" No results found for: "PROLACTIN" Lab Results  Component Value Date   CHOL 226 (H) 01/25/2008   TRIG 152 (H) 01/25/2008   HDL 63 01/25/2008   CHOLHDL 3.6 Ratio 01/25/2008   VLDL 30 01/25/2008   LDLCALC 133 (H) 01/25/2008   Lab Results  Component Value Date   TSH 1.370 02/16/2018   TSH 0.812 01/25/2008    Therapeutic Level Labs: No results found for: "LITHIUM" No results found for: "VALPROATE" No results found for: "CBMZ"  Current Medications: Current Outpatient  Medications  Medication Sig Dispense Refill   gabapentin (NEURONTIN) 300 MG capsule Take 1-2 capsules (300-600 mg total) by mouth See admin instructions. Take 300 mg every morning and 600 mg every night at bedtime. 90 capsule 3   ibuprofen (ADVIL,MOTRIN) 200 MG tablet Take 200 mg by mouth every 6 (six) hours as needed for mild pain or moderate pain.     lamoTRIgine (LAMICTAL) 100 MG tablet Take 1 tablet (100 mg total) by mouth daily. Take 1 capsule daily 30 tablet 2   omeprazole (PRILOSEC) 20 MG capsule Take 20 mg by mouth daily.     prazosin (MINIPRESS) 1 MG capsule Take 1 capsule (1 mg total) by mouth at  bedtime. 30 capsule 2   propranolol (INDERAL) 10 MG tablet Take 1 tablet (10 mg total) by mouth 3 (three) times daily. 90 tablet 2   sertraline (ZOLOFT) 100 MG tablet Take 1 tablet (100 mg total) by mouth daily. 30 tablet 2   traZODone (DESYREL) 150 MG tablet Take 1 tablet (150 mg total) by mouth at bedtime as needed for sleep. 30 tablet 2   No current facility-administered medications for this visit.     Musculoskeletal: Strength & Muscle Tone:  Unable to assess due to virtual visit [place]:  Unable to assess due to virtual visit Patient leans: N/A  Psychiatric Specialty Exam: Review of Systems  unknown if currently breastfeeding.There is no height or weight on file to calculate BMI.  General Appearance:  Casual  Eye Contact:   Good  Speech:  Clear and Coherent and Normal Rate  Volume:  Normal  Mood:  Euthymic  Affect:  Congruent  Thought Process:  Coherent, Goal Directed and Linear  Orientation:  Full (Time, Place, and Person)  Thought Content: WDL and Logical   Suicidal Thoughts:  No  Homicidal Thoughts:  No  Memory:  Immediate;   Good Recent;   Good Remote;   Good  Judgement:  Good  Insight:  Good  Psychomotor Activity:   Normal   Concentration:  Concentration: Good and Attention Span: Good  Recall:  Good  Fund of Knowledge: Good  Language: Good  Akathisia:   None  Handed:    AIMS (if indicated): not done  Assets:  Communication Skills Desire for Improvement Financial Resources/Insurance Housing Social Support  ADL's:  Intact  Cognition: WNL  Sleep:  Good   Screenings: GAD-7    Flowsheet Row Video Visit from 01/29/2022 in [facility] Video Visit from 11/05/2021 in [facility] Video Visit from 04/23/2021 in [facility] Video Visit from 01/27/2021 in [place] from 07/04/2020 in [facility]  Total GAD-7 Score '3 12 3 7 9      '$ PHQ2-9    Flowsheet Row Video Visit from 01/29/2022 in [facility] Video Visit from 11/05/2021 in [facility] Video Visit from 04/23/2021 in [facility] Counselor from 02/13/2021 in [facility] Video Visit from 01/27/2021 in [facility]  PHQ-2 Total Score 1 4 0 1 1  PHQ-9 Total Score '5 16 1 '$ -- 6      Flowsheet Row Video Visit from 04/23/2021 in [facility] Counselor from 02/13/2021 in [facility] ED from 08/31/2020 in [facility] Emergency Department at [place] No Risk No Risk No Risk        Assessment and Plan: Patient reports stable mood with current medications but still reporting a lot of anxiety and feeling overwhelmed at work.  Patient tried Minipress for nightmares but it made her nightmares worse and she fell 1 day.  She stopped Minipress after 4 days.  Will increase Zoloft to help with anxiety.  Will stop Minipress due to side effects.  MDD, recurrent, moderate PTSD GAD Continue- gabapentin (NEURONTIN) 300 MG capsule; Take 1-2 capsules (300-600 mg total) by mouth See admin instructions. Take 300 mg every morning and 600 mg  every night at bedtime.  Dispense: 90 capsule; Refill: 2 Increase- sertraline (ZOLOFT) to 150 MG tablet; Take  1.5 tablet (150 mg total) by mouth daily.  Dispense: 45 tablet; Refill: 2 Continue- traZODone (DESYREL) 150 MG tablet; Take 1 tablet (150 mg total) by mouth at bedtime as needed for sleep.  Dispense: 30 tablet; Refill: 2 Continue Lamictal 100 mg daily. Dispense: 30 tablet; Refill: 2 Continue- propranolol (INDERAL) 10 MG tablet; Take 1 tablet (10 mg total) by mouth 3 (three) times daily.  Dispense: 90 tablet; Refill: 2 -Stop Minipress due to side effects.  Alcohol use disorder in early remission  Continue Neurontin as above   Follow up in 2 months   Armando Reichert, MD 05/29/2022, 2:42 PM

## 2022-06-02 ENCOUNTER — Encounter (HOSPITAL_COMMUNITY): Payer: Self-pay | Admitting: Psychiatry

## 2022-07-30 ENCOUNTER — Telehealth (INDEPENDENT_AMBULATORY_CARE_PROVIDER_SITE_OTHER): Payer: Self-pay | Admitting: Psychiatry

## 2022-07-30 ENCOUNTER — Encounter (HOSPITAL_COMMUNITY): Payer: Self-pay | Admitting: Psychiatry

## 2022-07-30 DIAGNOSIS — F1011 Alcohol abuse, in remission: Secondary | ICD-10-CM

## 2022-07-30 DIAGNOSIS — F411 Generalized anxiety disorder: Secondary | ICD-10-CM

## 2022-07-30 DIAGNOSIS — F4310 Post-traumatic stress disorder, unspecified: Secondary | ICD-10-CM

## 2022-07-30 DIAGNOSIS — F331 Major depressive disorder, recurrent, moderate: Secondary | ICD-10-CM

## 2022-07-30 MED ORDER — GABAPENTIN 300 MG PO CAPS
300.0000 mg | ORAL_CAPSULE | ORAL | 2 refills | Status: AC
Start: 2022-07-30 — End: ?

## 2022-07-30 MED ORDER — PROPRANOLOL HCL 10 MG PO TABS: 10.0000 mg | ORAL_TABLET | Freq: Three times a day (TID) | ORAL | 2 refills | Status: AC

## 2022-07-30 MED ORDER — LAMOTRIGINE 100 MG PO TABS: 100.0000 mg | ORAL_TABLET | Freq: Every day | ORAL | 2 refills | Status: AC

## 2022-07-30 MED ORDER — SERTRALINE HCL 100 MG PO TABS: 200.0000 mg | ORAL_TABLET | Freq: Every day | ORAL | 2 refills | Status: AC

## 2022-07-30 MED ORDER — TRAZODONE HCL 150 MG PO TABS: 150.0000 mg | ORAL_TABLET | Freq: Every evening | ORAL | 2 refills | Status: AC | PRN

## 2022-07-30 MED ORDER — GABAPENTIN 300 MG PO CAPS
300.0000 mg | ORAL_CAPSULE | ORAL | 2 refills | Status: DC
Start: 2022-07-30 — End: 2022-07-30
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
## 2022-08-10 ENCOUNTER — Other Ambulatory Visit (HOSPITAL_COMMUNITY): Payer: Medicaid Other

## 2022-08-10 VITALS — BP 163/94 | HR 70

## 2022-08-10 DIAGNOSIS — F4310 Post-traumatic stress disorder, unspecified: Secondary | ICD-10-CM

## 2022-08-10 DIAGNOSIS — Z79899 Other long term (current) drug therapy: Secondary | ICD-10-CM

## 2022-08-10 DIAGNOSIS — F331 Major depressive disorder, recurrent, moderate: Secondary | ICD-10-CM

## 2022-08-10 NOTE — Progress Notes (Signed)
Patient in for due ordered lab work today and presented with appropriate affect, level and pleasant mood and denied any current issues or symptoms.  Patient's BP was 151/105 and then after completing labs was still 163/94.  Patient reported she had no insurance to follow up with a primary provider and agreed to meet with DSS Medicaid specialist today at our center to apply.  Patient agreed with plan to follow up with PCP referral and on concerns for elevated BP.  Patient's labs drawn from her right hand after initial attempt on her right arm.  Patient tolerated lab draw without complaint of pain or discomfort and would like lab results to be emailed or shared with her later if possible to [email or web address].  Patient with no other concerns at this time and will follow up for next appointment 09/29/22.  Patient to call if any issues prior to next appointment.

## 2022-08-12 LAB — CBC WITH DIFFERENTIAL/PLATELET
Basophils Absolute: 0 10*3/uL (ref 0.0–0.2)
Basos: 1 %
EOS (ABSOLUTE): 0 10*3/uL (ref 0.0–0.4)
Eos: 1 %
Hematocrit: 43.1 % (ref 34.0–46.6)
Hemoglobin: 13.9 g/dL (ref 11.1–15.9)
Immature Grans (Abs): 0 10*3/uL (ref 0.0–0.1)
Immature Granulocytes: 0 %
Lymphocytes Absolute: 2.2 10*3/uL (ref 0.7–3.1)
Lymphs: 33 %
MCH: 30.5 pg (ref 26.6–33.0)
MCHC: 32.3 g/dL (ref 31.5–35.7)
MCV: 95 fL (ref 79–97)
Monocytes Absolute: 0.4 10*3/uL (ref 0.1–0.9)
Monocytes: 6 %
Neutrophils Absolute: 3.9 10*3/uL (ref 1.4–7.0)
Neutrophils: 59 %
Platelets: 333 10*3/uL (ref 150–450)
RBC: 4.56 x10E6/uL (ref 3.77–5.28)
RDW: 13 % (ref 11.7–15.4)
WBC: 6.6 10*3/uL (ref 3.4–10.8)

## 2022-08-12 LAB — COMPREHENSIVE METABOLIC PANEL
ALT: 21 IU/L (ref 0–32)
AST: 25 IU/L (ref 0–40)
Albumin/Globulin Ratio: 1.3 (ref 1.2–2.2)
Albumin: 4.5 g/dL (ref 3.9–4.9)
Alkaline Phosphatase: 101 IU/L (ref 44–121)
BUN/Creatinine Ratio: 8 — ABNORMAL LOW (ref 9–23)
BUN: 8 mg/dL (ref 6–24)
Bilirubin Total: <0.2 mg/dL (ref 0.0–1.2)
CO2: 17 mmol/L — ABNORMAL LOW (ref 20–29)
Calcium: 9.4 mg/dL (ref 8.7–10.2)
Chloride: 99 mmol/L (ref 96–106)
Creatinine, Ser: 1.03 mg/dL — ABNORMAL HIGH (ref 0.57–1.00)
Globulin, Total: 3.4 g/dL (ref 1.5–4.5)
Glucose: 65 mg/dL — ABNORMAL LOW (ref 70–99)
Potassium: 4.1 mmol/L (ref 3.5–5.2)
Sodium: 136 mmol/L (ref 134–144)
Total Protein: 7.9 g/dL (ref 6.0–8.5)
eGFR: 67 mL/min/{1.73_m2} (ref >59)

## 2022-08-12 LAB — VITAMIN D 25 HYDROXY (VIT D DEFICIENCY, FRACTURES): Vit D, 25-Hydroxy: 43.8 ng/mL (ref 30.0–100.0)

## 2022-08-12 LAB — SPECIMEN STATUS REPORT

## 2022-08-12 LAB — TSH: TSH: 2.78 u[IU]/mL (ref 0.450–4.500)

## 2022-08-19 ENCOUNTER — Telehealth (HOSPITAL_COMMUNITY): Payer: Self-pay

## 2022-09-29 ENCOUNTER — Ambulatory Visit (HOSPITAL_COMMUNITY): Payer: Medicaid Other | Admitting: Student

## 2022-09-29 NOTE — Progress Notes (Deleted)
[facility] Psychiatric Initial Adult Assessment  Date: 09/29/2022, 7:15 AM  Patient Identification: [name] "Edan" MRN: [id number] DOB: Aug 07, 1975  Referral Source: Cain Saupe, MD *** Therapist: ***  ASSESSMENT / PLAN  AJNA MATOS is a 47 y.o. female with PMH of bipolar 1 d/o, AUD, PTSD*** suicide attempt, *** inpt psych admission, who presented in person to [facility] for initial evaluation of ***    ***th grader @ ***, living with ***,  *** Past medication trials:  Status of problem: *** Interventions: *** *** There are no diagnoses linked to this encounter.  Follow-up on: Visit date not found  Future Appointments  Date Time Provider Department Center  09/29/2022  8:00 AM Princess Bruins, DO [facility] None     Patient was given contact information for behavioral health clinic and was instructed to call 911 for emergencies.   HISTORY OF PRESENT ILLNESS  Chief Complaint: No chief complaint on file.    ***  Patient amenable to *** after discussing the risks, benefits, and side effects. Otherwise patient had no other questions or concerns and was amenable to plan per above.  Patient's main concern: ***  Patient's goal: ***  Safety: ***. Patient contracted to safety, would call ***. Patient *** aware of [facility], 988 and 911 as well.  *** access to guns or weapons.  Current outpatient therapist: *** Current rx: ***  ROS   PSYCH ROS  Depression: *** depressed mood and pervasive sadness, anhedonia, insomnia***, hypersomnia***, guilt, decreased energy, decreased concentration, decreased*** or increased*** appetite, psychomotor slowing*** restlessness***, and suicidal ideation or intentions Duration of Depression Symptoms: No data recorded (Hypo-) Mania: *** excessive energy despite decreased need for sleep or persistent irritability (<2hr/night x4-7days), grandiosity/inflated self-esteem, sexual indiscretion, racing  thoughts, pressured speech, distractibility/inattention. Anxiety: *** having difficulty controlling/managing anxiety/worry/stress and that it is out of proportion with stressors. *** associated sxs of restlessness, being on edge, easily fatigued, concentration difficulty, irritability, muscle tension, sleep disturbance.  Psychosis:  *** AVH, delusions, paranoia, first rank symptoms.  Trauma:  Trauma: *** life-threatening, physical, sexual, witnessed *** flashbacks, nightmares, hypervigilance/hyperarousal, avoidance.  Acute stress d/o 0-3d *** Adjustment d/o 3d-13mo PTSD >61mo  Eating: ***  *** intense fear of gaining weight, perception of being fat, restricting to lose weight. *** overeating in one sitting, unable to stop eating or losing track of time while eating, compensates by restricting, over-exercising, vomiting, laxative misuse, diuretic misuse.   Mild: 1 to 3 episodes of inappropriate compensatory behaviours per week. Moderate: 4 to 7 episodes  Severe: 8 to 13 episodes  Extreme: 14 or more episodes   S: Do you ever make yourself sick because you feel uncomfortably full? C: Do you worry you have lost control over how much you eat? O: Have you recently lost more than one stone [14 pounds/6.4kg] in a 3 month period? F: Do you believe yourself to be fat when others say you are too thin? F: Would you say that food dominates your life?   PAST HISTORY   Past Psychiatric History:  Hospitalizations: *** Suicide attempts: *** NSSIB: *** Psychotherapy: *** Dx: *** Rx: *** Head trauma: ***  Past Medical History: Dx:  has a past medical history of Bipolar 1 disorder (HCC), History of miscarriage, and Hypertension.  Head trauma: *** Seizures: *** Allergies: Latex and Penicillins   Family Psychiatric History:  Suicide: *** Homicide: *** Hospitalization: *** BiPD: *** SCZ/SCzA: *** Others: ***  Social History:  Living with: *** Income: ***  Marital Status:  *** Children: *** Support: *** Guns/Weapons: *** Legal: *** DUI/DWI: *** Jail/prison: *** Developmental: ***  Substance Use History: EtOH:  reports that she does not currently use alcohol.*** Nicotine:  reports that she has been smoking cigarettes. She has been smoking an average of .5 packs per day. She has never used smokeless tobacco.*** THC/CBD: *** IV drug use: *** Stimulants: *** Opiates: *** Sedative/hypnotics: *** Hallucinogens: *** Seizures: *** DT: *** Detox: *** Residential: ***  Substance Abuse History in the last 12 months:  {yes no:314532}    Past Medical History:  Past Medical History:  Diagnosis Date   Bipolar 1 disorder (HCC)    History of miscarriage    Hypertension     Past Surgical History:  Procedure Laterality Date   NO PAST SURGERIES     TUBAL LIGATION N/A 04/13/2014   Procedure: POST PARTUM TUBAL LIGATION;  Surgeon: [name], MD;  Location: [facility];  Service: Gynecology;  Laterality: N/A;    Family History:  Family History  Problem Relation Age of Onset   Cancer Mother 86       breast   Cancer Maternal Grandmother 20       breast   Cancer Maternal Grandfather        stomach   Hypertension Father    Heart disease Father     Social History:   Social History   Socioeconomic History   Marital status: Divorced    Spouse name: Not on file   Number of children: Not on file   Years of education: Not on file   Highest education level: Not on file  Occupational History   Not on file  Tobacco Use   Smoking status: Every Day    Packs/day: .5    Types: Cigarettes   Smokeless tobacco: Never  Vaping Use   Vaping Use: Never used  Substance and Sexual Activity   Alcohol use: Not Currently    Comment: none   Drug use: Not Currently    Types: Marijuana   Sexual activity: Yes    Birth control/protection: Surgical  Other Topics Concern   Not on file  Social History Narrative   Not on file   Social Determinants of Health    Financial Resource Strain: Not on file  Food Insecurity: Not on file  Transportation Needs: Not on file  Physical Activity: Not on file  Stress: Not on file  Social Connections: Not on file    Allergies:  Allergies  Allergen Reactions   Latex Rash   Penicillins Rash    Has patient had a PCN reaction causing immediate rash, facial/tongue/throat swelling, SOB or lightheadedness with hypotension: Yes  Has patient had a PCN reaction causing severe rash involving mucus membranes or skin necrosis: No Has patient had a PCN reaction that required hospitalization No Has patient had a PCN reaction occurring within the last 10 years: No If all of the above answers are "NO", then may proceed with Cephalosporin use.     Current Medications: Current Outpatient Medications  Medication Sig Dispense Refill   gabapentin (NEURONTIN) 300 MG capsule Take 1-2 capsules (300-600 mg total) by mouth See admin instructions. Take 300 mg every morning and 600 mg every night at bedtime. 90 capsule 2   ibuprofen (ADVIL,MOTRIN) 200 MG tablet Take 200 mg by mouth every 6 (six) hours as needed for mild pain or moderate pain.     lamoTRIgine (LAMICTAL) 100 MG tablet Take 1 tablet (100 mg total) by mouth  daily. Take 1 capsule daily 30 tablet 2   omeprazole (PRILOSEC) 20 MG capsule Take 20 mg by mouth daily.     propranolol (INDERAL) 10 MG tablet Take 1 tablet (10 mg total) by mouth 3 (three) times daily. 90 tablet 2   sertraline (ZOLOFT) 100 MG tablet Take 2 tablets (200 mg total) by mouth daily. 60 tablet 2   traZODone (DESYREL) 150 MG tablet Take 1 tablet (150 mg total) by mouth at bedtime as needed for sleep. 30 tablet 2   No current facility-administered medications for this visit.        OBJECTIVE  There were no vitals taken for this visit.  Psychiatric Specialty Exam: General Appearance: Casual, faily groomed, not guarded ***  Eye Contact:  Good  Speech:  Clear, coherent, normal rate, non-pressured   Volume:  Normal  Mood:  "***"  Affect:  Appropriate, congruent, full range   Thought Content: Logical, rumination. No command or non-command AVH, paranoid delusions, first rank sxs.   Suicidal Thoughts:  Denied active and passive SI  Homicidal Thoughts:  Denied active and passive HI  Thought Process:  Coherent, goal-directed, mostly linear, circumstantial at times ***  Orientation:  A&Ox4  Memory:  Immediate good  Judgement:  Fair  Insight:  Fair, shallow  Concentration:  Attention and concentration good ***  Recall:  [employer] of Knowledge:  Fair  Language:  Good, no aphasia  Psychomotor Activity:  Normal  Akathisia:  NA, no antipsychotics ***  AIMS (if indicated):  NA, no antipsychotics   ***  Assets:  {Assets (PAA):22698}  ADL's:  Intact  Cognition:  WNL  Sleep:  ***     Wt Readings from Last 3 Encounters:  08/31/20 177 lb (80.3 kg)  07/04/20 172 lb (78 kg)  05/06/20 173 lb (78.5 kg)   Temp Readings from Last 3 Encounters:  08/31/20 98.1 F (36.7 C) (Oral)  02/17/18 98.1 F (36.7 C) (Oral)  01/05/18 98 F (36.7 C) (Oral)   BP Readings from Last 3 Encounters:  08/10/22 (!) 163/94  08/31/20 (!) 158/90  07/04/20 (!) 153/99   Pulse Readings from Last 3 Encounters:  08/10/22 70  08/31/20 61  07/04/20 80     Physical Exam  Strength & Muscle Tone: {desc; muscle tone:32375} Gait & Station: {PE GAIT ED NATL:22525}     Metabolic Disorder Labs: No results found for: "HGBA1C", "MPG" No results found for: "PROLACTIN" Lab Results  Component Value Date   CHOL 226 (H) 01/25/2008   TRIG 152 (H) 01/25/2008   HDL 63 01/25/2008   CHOLHDL 3.6 Ratio 01/25/2008   VLDL 30 01/25/2008   LDLCALC 133 (H) 01/25/2008   Lab Results  Component Value Date   TSH 2.780 08/10/2022    Therapeutic Level Labs: No results found for: "LITHIUM" No results found for: "CBMZ" No results found for: "VALPROATE"  Screenings:  GAD-7    Flowsheet Row Video Visit from 01/29/2022 in  [facility] Video Visit from 11/05/2021 in [facility] Video Visit from 04/23/2021 in [facility] Video Visit from 01/27/2021 in [facility] Clinical Support from 07/04/2020 in [facility]  Total GAD-7 Score 3 12 3 7 9       PHQ2-9    Flowsheet Row Video Visit from 01/29/2022 in [facility] Video Visit from 11/05/2021 in [facility] Video Visit from 04/23/2021 in [facility]  Center Counselor from 02/13/2021 in [facility] Video Visit from 01/27/2021 in [facility]  PHQ-2 Total Score 1 4 0 1 1  PHQ-9 Total Score 5 16 1  -- 6      Flowsheet Row Video Visit from 04/23/2021 in [facility] Counselor from 02/13/2021 in [facility] ED from 08/31/2020 in [facility] Emergency Department at [facility]  C-SSRS RISK CATEGORY No Risk No Risk No Risk       Collaboration of Care: Case discussed with current outpatient attending, see attending's attestation for additional information    Signed: Princess Bruins, DO Psychiatry Resident, PGY-2 [facility]

## 2023-02-15 NOTE — Progress Notes (Signed)
 This encounter was created in error - please disregard.

## 2023-02-16 ENCOUNTER — Encounter: Payer: Self-pay | Admitting: Internal Medicine

## 2023-02-16 ENCOUNTER — Encounter: Payer: Medicaid Other | Admitting: Internal Medicine

## 2023-02-23 ENCOUNTER — Encounter: Payer: Self-pay | Admitting: Internal Medicine
